# Patient Record
Sex: Male | Born: 1985 | Race: White | Hispanic: No | Marital: Single | Smoking: Current every day smoker
Health system: Southern US, Community
[De-identification: ages and names within clinical notes are randomized; demographics above are authoritative.]

## PROBLEM LIST (undated history)

## (undated) DIAGNOSIS — G43909 Migraine, unspecified, not intractable, without status migrainosus: Secondary | ICD-10-CM

## (undated) HISTORY — PX: HYPOSPADIAS CORRECTION: SHX483

---
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
## 2008-01-21 ENCOUNTER — Emergency Department: Payer: Self-pay | Admitting: Emergency Medicine

## 2008-05-07 ENCOUNTER — Emergency Department: Payer: Self-pay | Admitting: Internal Medicine

## 2008-05-07 ENCOUNTER — Other Ambulatory Visit: Payer: Self-pay

## 2008-05-10 ENCOUNTER — Emergency Department: Payer: Self-pay | Admitting: Emergency Medicine

## 2008-05-23 ENCOUNTER — Emergency Department: Payer: Self-pay | Admitting: Emergency Medicine

## 2008-05-31 ENCOUNTER — Emergency Department: Payer: Self-pay | Admitting: Emergency Medicine

## 2008-06-11 ENCOUNTER — Emergency Department: Payer: Self-pay | Admitting: Emergency Medicine

## 2008-06-25 ENCOUNTER — Emergency Department (HOSPITAL_COMMUNITY): Admission: EM | Admit: 2008-06-25 | Discharge: 2008-06-25 | Payer: Self-pay | Admitting: *Deleted

## 2013-03-22 ENCOUNTER — Encounter (HOSPITAL_COMMUNITY): Payer: Self-pay

## 2013-03-22 ENCOUNTER — Emergency Department (HOSPITAL_COMMUNITY)
Admission: EM | Admit: 2013-03-22 | Discharge: 2013-03-23 | Disposition: A | Discharge status: Home / Self Care | Payer: Self-pay | Attending: Emergency Medicine | Admitting: Emergency Medicine

## 2013-03-22 DIAGNOSIS — F17200 Nicotine dependence, unspecified, uncomplicated: Secondary | ICD-10-CM | POA: Insufficient documentation

## 2013-03-22 DIAGNOSIS — R05 Cough: Secondary | ICD-10-CM | POA: Insufficient documentation

## 2013-03-22 DIAGNOSIS — R1110 Vomiting, unspecified: Secondary | ICD-10-CM | POA: Insufficient documentation

## 2013-03-22 DIAGNOSIS — R059 Cough, unspecified: Secondary | ICD-10-CM | POA: Insufficient documentation

## 2013-03-22 DIAGNOSIS — G43909 Migraine, unspecified, not intractable, without status migrainosus: Secondary | ICD-10-CM | POA: Insufficient documentation

## 2013-03-22 HISTORY — DX: Migraine, unspecified, not intractable, without status migrainosus: G43.909

## 2013-03-22 MED ORDER — ONDANSETRON HCL 4 MG/2ML IJ SOLN
4.0000 mg | Freq: Once | INTRAMUSCULAR | Status: AC
  Administered 2013-03-23: 4 mg via INTRAVENOUS
  Filled 2013-03-22: qty 2

## 2013-03-22 MED ORDER — SODIUM CHLORIDE 0.9 % IV BOLUS (SEPSIS)
2000.0000 mL | Freq: Once | INTRAVENOUS | Status: AC
  Administered 2013-03-23: 2000 mL via INTRAVENOUS

## 2013-03-22 NOTE — ED Notes (Signed)
[id number]<HY> Expected date:<BR> Expected time:<BR> Means of arrival:<BR> Comments:<BR> EMS/26 yo with migraine/vomiting

## 2013-03-22 NOTE — ED Notes (Signed)
Per EMS: Pt c/o of migraine and vomiting. HA started 30 minutes before call. Pt also reports having flu like symptoms and cough. No allergies. Hx of migraines.

## 2013-03-23 ENCOUNTER — Encounter (HOSPITAL_COMMUNITY): Payer: Self-pay

## 2013-03-23 LAB — URINALYSIS, ROUTINE W REFLEX MICROSCOPIC
Bilirubin Urine: NEGATIVE
Glucose, UA: NEGATIVE mg/dL
Hgb urine dipstick: NEGATIVE
Ketones, ur: NEGATIVE mg/dL
Leukocytes, UA: NEGATIVE
Nitrite: NEGATIVE
Protein, ur: NEGATIVE mg/dL
Specific Gravity, Urine: 1.014 (ref 1.005–1.030)
Urobilinogen, UA: 0.2 mg/dL (ref 0.0–1.0)
pH: 6 (ref 5.0–8.0)

## 2013-03-23 MED ORDER — PROMETHAZINE-DM 6.25-15 MG/5ML PO SYRP: 5.0000 mL | ORAL_SOLUTION | Freq: Four times a day (QID) | ORAL | Status: DC | PRN

## 2013-03-23 MED ORDER — METOCLOPRAMIDE HCL 5 MG/ML IJ SOLN
10.0000 mg | Freq: Once | INTRAMUSCULAR | Status: AC
  Administered 2013-03-23: 10 mg via INTRAVENOUS
  Filled 2013-03-23: qty 2

## 2013-03-23 MED ORDER — DIPHENHYDRAMINE HCL 50 MG/ML IJ SOLN
25.0000 mg | Freq: Once | INTRAMUSCULAR | Status: AC
  Administered 2013-03-23: 25 mg via INTRAVENOUS
  Filled 2013-03-23: qty 1

## 2013-03-23 MED ORDER — KETOROLAC TROMETHAMINE 30 MG/ML IJ SOLN
30.0000 mg | Freq: Once | INTRAMUSCULAR | Status: AC
  Administered 2013-03-23: 30 mg via INTRAVENOUS
  Filled 2013-03-23: qty 1

## 2013-03-23 MED ORDER — GUAIFENESIN ER 1200 MG PO TB12: 1.0000 | ORAL_TABLET | Freq: Two times a day (BID) | ORAL | Status: DC

## 2013-03-23 NOTE — ED Provider Notes (Signed)
History     CSN: [id number]  Arrival date & time 03/22/13  2335   First MD Initiated Contact with Patient 03/22/13 2349      Chief Complaint  Patient presents with  . Migraine  . Emesis    (Consider location/radiation/quality/duration/timing/severity/associated sxs/prior treatment) HPI History presents emergency department with migraine headache.  Patient, states, that he's been coughing and he had a coughing fit, which set off a migraine headache.  Patient denies chest pain, shortness of breath, visual changes, vomiting, diarrhea, abdominal pain, weakness, fever, or rash.  He should states he did not take any medications prior to arrival for his symptoms.  Light and sounds seem to make his headache, worse Past Medical History  Diagnosis Date  . Migraine     History reviewed. No pertinent past surgical history.  History reviewed. No pertinent family history.  History  Substance Use Topics  . Smoking status: Current Every Day Smoker    Types: Cigarettes  . Smokeless tobacco: Not on file  . Alcohol Use: Yes      Review of Systems All other systems negative except as documented in the HPI. All pertinent positives and negatives as reviewed in the HPI. Allergies  Review of patient's allergies indicates no known allergies.  Home Medications  No current outpatient prescriptions on file.  BP 125/77  Pulse 96  Temp(Src) 98.9 F (37.2 C) (Oral)  Resp 26  Wt 180 lb (81.647 kg)  SpO2 93%  Physical Exam  Nursing note and vitals reviewed. Constitutional: He is oriented to person, place, and time. He appears well-developed and well-nourished. No distress.  HENT:  Head: Normocephalic and atraumatic.  Mouth/Throat: Oropharynx is clear and moist.  Eyes: EOM are normal. Pupils are equal, round, and reactive to light.  Neck: Normal range of motion. Neck supple.  Cardiovascular: Normal rate and regular rhythm.  Exam reveals no gallop and no friction rub.   No murmur  heard. Pulmonary/Chest: Effort normal and breath sounds normal. No respiratory distress.  Neurological: He is alert and oriented to person, place, and time. He exhibits normal muscle tone. Coordination normal.  Skin: Skin is warm and dry.    ED Course  Procedures (including critical care time)  Labs Reviewed  URINALYSIS, ROUTINE W REFLEX MICROSCOPIC   Aisha is given migraine cocktail and Toradol, Benadryl and Reglan and has complete resolution of his migraine headache.  Patient is advised to return here as needed.  Told to increase his fluid intake.  Patient does not have any neurological deficits noted on exam.  MDM          Carlyle Dolly, PA-C 03/27/13 0119

## 2013-03-27 NOTE — ED Provider Notes (Signed)
Medical screening examination/treatment/procedure(s) were performed by non-physician practitioner and as supervising physician I was immediately available for consultation/collaboration.  John-Adam Graylen [place], M.D.   John-Adam Ludie Pavlik, MD 03/27/13 0447 

## 2013-04-05 ENCOUNTER — Emergency Department (HOSPITAL_COMMUNITY)
Admission: EM | Admit: 2013-04-05 | Discharge: 2013-04-06 | Disposition: A | Discharge status: Home / Self Care | Payer: Self-pay | Attending: Emergency Medicine | Admitting: Emergency Medicine

## 2013-04-05 ENCOUNTER — Encounter (HOSPITAL_COMMUNITY): Payer: Self-pay | Admitting: *Deleted

## 2013-04-05 DIAGNOSIS — F17200 Nicotine dependence, unspecified, uncomplicated: Secondary | ICD-10-CM | POA: Insufficient documentation

## 2013-04-05 DIAGNOSIS — H53149 Visual discomfort, unspecified: Secondary | ICD-10-CM | POA: Insufficient documentation

## 2013-04-05 DIAGNOSIS — M545 Low back pain, unspecified: Secondary | ICD-10-CM | POA: Insufficient documentation

## 2013-04-05 DIAGNOSIS — G43909 Migraine, unspecified, not intractable, without status migrainosus: Secondary | ICD-10-CM | POA: Insufficient documentation

## 2013-04-05 DIAGNOSIS — M549 Dorsalgia, unspecified: Secondary | ICD-10-CM

## 2013-04-05 NOTE — ED Notes (Signed)
Severe migraine since ~ 1900.  Did have some emesis, mild blurred vision. X 2 days jumped 10 feet, landed on feet and having shooting low and upper back pain. Duration 10-15 minutes. [facility]

## 2013-04-06 MED ORDER — METOCLOPRAMIDE HCL 5 MG/ML IJ SOLN
10.0000 mg | Freq: Once | INTRAMUSCULAR | Status: AC
  Administered 2013-04-06: 10 mg via INTRAVENOUS
  Filled 2013-04-06: qty 2

## 2013-04-06 MED ORDER — KETOROLAC TROMETHAMINE 30 MG/ML IJ SOLN
30.0000 mg | Freq: Once | INTRAMUSCULAR | Status: AC
  Administered 2013-04-06: 30 mg via INTRAVENOUS
  Filled 2013-04-06: qty 1

## 2013-04-06 MED ORDER — DIPHENHYDRAMINE HCL 50 MG/ML IJ SOLN
12.5000 mg | Freq: Once | INTRAMUSCULAR | Status: AC
  Administered 2013-04-06: 12.5 mg via INTRAVENOUS
  Filled 2013-04-06: qty 1

## 2013-04-06 MED ORDER — IBUPROFEN 600 MG PO TABS: 600.0000 mg | ORAL_TABLET | Freq: Four times a day (QID) | ORAL | Status: DC | PRN

## 2013-04-06 MED ORDER — SODIUM CHLORIDE 0.9 % IV BOLUS (SEPSIS)
1000.0000 mL | Freq: Once | INTRAVENOUS | Status: AC
  Administered 2013-04-06: 1000 mL via INTRAVENOUS

## 2013-04-06 NOTE — ED Provider Notes (Signed)
Medical screening examination/treatment/procedure(s) were performed by non-physician practitioner and as supervising physician I was immediately available for consultation/collaboration.  Teairra Millar K Sheera Illingworth-Rasch, MD 04/06/13 0344 

## 2013-04-06 NOTE — ED Provider Notes (Signed)
History     CSN: [id number]  Arrival date & time 04/05/13  2104   First MD Initiated Contact with Patient 04/06/13 0004      Chief Complaint  Patient presents with  . Migraine  . Back Pain    (Consider location/radiation/quality/duration/timing/severity/associated sxs/prior treatment) HPI Comments: Should the history of migraine headaches, states she's had a headache since about 7:00 tonight.  He also states, that for the last 2, days.  He's had low back pain.  He has chronic back pain, but this was exacerbated by jumping off a building 2, days, ago.  He has not taken any medication."  He has no money."  Patient is a 27 y.o. male presenting with migraines and back pain. The history is provided by the patient.  Migraine This is a recurrent problem. The current episode started today. The problem occurs intermittently. The problem has been unchanged. Associated symptoms include headaches. Pertinent negatives include no chills, fever or rash. Nothing aggravates the symptoms. He has tried nothing for the symptoms.  Back Pain Associated symptoms: headaches   Associated symptoms: no fever     Past Medical History  Diagnosis Date  . Migraine     History reviewed. No pertinent past surgical history.  No family history on file.  History  Substance Use Topics  . Smoking status: Current Every Day Smoker    Types: Cigarettes  . Smokeless tobacco: Not on file  . Alcohol Use: Yes      Review of Systems  Constitutional: Negative for fever and chills.  Eyes: Positive for photophobia. Negative for visual disturbance.  Musculoskeletal: Positive for back pain.  Skin: Negative for rash.  Neurological: Positive for headaches. Negative for dizziness.  All other systems reviewed and are negative.    Allergies  Review of patient's allergies indicates no known allergies.  Home Medications   Current Outpatient Rx  Name  Route  Sig  Dispense  Refill  . GUAIFENESIN PO   Oral   Take  1 tablet by mouth 2 (two) times daily as needed (for cough and congestion).         Marland Kitchen ibuprofen (ADVIL,MOTRIN) 600 MG tablet   Oral   Take 1 tablet (600 mg total) by mouth every 6 (six) hours as needed for pain.   30 tablet   0     BP 129/79  Pulse 87  Temp(Src) 98.4 F (36.9 C) (Oral)  Resp 16  SpO2 97%  Physical Exam  Nursing note and vitals reviewed. Constitutional: He appears well-developed and well-nourished.  HENT:  Head: Normocephalic.  Eyes: Pupils are equal, round, and reactive to light.  Neck: Normal range of motion.  Cardiovascular: Normal rate.   Abdominal: Soft.  Musculoskeletal:       Back:  Neurological: He is alert.  Skin: Skin is warm and dry.    ED Course  Procedures (including critical care time)  Labs Reviewed - No data to display No results found.   1. Migraine headache   2. Back pain       MDM  Vision, sleeping after being, medicated we'll discharge home with a prescription for ibuprofen, and our resource list         Arman Filter, NP 04/06/13 0206

## 2013-04-21 ENCOUNTER — Emergency Department (HOSPITAL_COMMUNITY): Payer: Self-pay

## 2013-04-21 ENCOUNTER — Encounter (HOSPITAL_COMMUNITY): Payer: Self-pay | Admitting: Emergency Medicine

## 2013-04-21 ENCOUNTER — Emergency Department (HOSPITAL_COMMUNITY)
Admission: EM | Admit: 2013-04-21 | Discharge: 2013-04-22 | Disposition: A | Discharge status: Home / Self Care | Payer: Self-pay | Attending: Emergency Medicine | Admitting: Emergency Medicine

## 2013-04-21 DIAGNOSIS — J3489 Other specified disorders of nose and nasal sinuses: Secondary | ICD-10-CM | POA: Insufficient documentation

## 2013-04-21 DIAGNOSIS — R05 Cough: Secondary | ICD-10-CM | POA: Insufficient documentation

## 2013-04-21 DIAGNOSIS — Z8679 Personal history of other diseases of the circulatory system: Secondary | ICD-10-CM | POA: Insufficient documentation

## 2013-04-21 DIAGNOSIS — R51 Headache: Secondary | ICD-10-CM | POA: Insufficient documentation

## 2013-04-21 DIAGNOSIS — J40 Bronchitis, not specified as acute or chronic: Secondary | ICD-10-CM

## 2013-04-21 DIAGNOSIS — R062 Wheezing: Secondary | ICD-10-CM | POA: Insufficient documentation

## 2013-04-21 DIAGNOSIS — H6690 Otitis media, unspecified, unspecified ear: Secondary | ICD-10-CM | POA: Insufficient documentation

## 2013-04-21 DIAGNOSIS — F17200 Nicotine dependence, unspecified, uncomplicated: Secondary | ICD-10-CM | POA: Insufficient documentation

## 2013-04-21 DIAGNOSIS — H6692 Otitis media, unspecified, left ear: Secondary | ICD-10-CM

## 2013-04-21 DIAGNOSIS — R059 Cough, unspecified: Secondary | ICD-10-CM | POA: Insufficient documentation

## 2013-04-21 DIAGNOSIS — J209 Acute bronchitis, unspecified: Secondary | ICD-10-CM | POA: Insufficient documentation

## 2013-04-21 NOTE — ED Notes (Signed)
Pt c/o cough, SHOB, "sore ears" x 3 weeks, pt also c/o sinus pain/pressure

## 2013-04-21 NOTE — ED Provider Notes (Signed)
History    This chart was scribed for Jonathon Clay, a non-physician practitioner working with Gavin Pound. Oletta Lamas, MD by Frederik Pear, ED Scribe. This patient was seen in room WTR7/WTR7 and the patient's care was started at 2353.   CSN: [id number]  Arrival date & time 04/21/13  2208   First MD Initiated Contact with Patient 04/21/13 2353      Chief Complaint  Patient presents with  . URI    (Consider location/radiation/quality/duration/timing/severity/associated sxs/prior treatment) The history is provided by the patient and medical records. No language interpreter was used.   HPI Comments: Jonathon Clay is a 27 y.o. male who is currently homeless and presents to the Emergency Department complaining of constant, worsening, moderate wheezing, rhinorrhea, and cough that began 3 weeks ago. He also complains of left otalgia that radiates down his left jaw that began today. In the ED, he complains of a headache that began after arrival. Headache is similar to previous headaches. He is afebrile. He states that he has a h/o of headaches and found relief previously from Excedrin.  Past Medical History  Diagnosis Date  . Migraine     Past Surgical History  Procedure Laterality Date  . Hypospadias correction      No family history on file.  History  Substance Use Topics  . Smoking status: Current Every Day Smoker    Types: Cigarettes  . Smokeless tobacco: Not on file  . Alcohol Use: Yes     Comment: occ      Review of Systems  Constitutional: Negative for fever, diaphoresis, appetite change and unexpected weight change.  HENT: Positive for ear pain and rhinorrhea. Negative for mouth sores and neck stiffness.   Eyes: Negative for visual disturbance.  Respiratory: Positive for cough and wheezing. Negative for chest tightness and shortness of breath.   Cardiovascular: Negative for chest pain.  Gastrointestinal: Negative for nausea, vomiting, abdominal pain, diarrhea and  constipation.  Endocrine: Negative for polydipsia, polyphagia and polyuria.  Genitourinary: Negative for dysuria, urgency, frequency and hematuria.  Musculoskeletal: Negative for back pain.  Skin: Negative for rash.  Allergic/Immunologic: Negative for immunocompromised state.  Neurological: Positive for headaches. Negative for syncope and light-headedness.  Hematological: Does not bruise/bleed easily.  Psychiatric/Behavioral: Negative for sleep disturbance. The patient is not nervous/anxious.     Allergies  Review of patient's allergies indicates no known allergies.  Home Medications   Current Outpatient Rx  Name  Route  Sig  Dispense  Refill  . GUAIFENESIN PO   Oral   Take 1 tablet by mouth 2 (two) times daily as needed (for cough and congestion).         Marland Kitchen ibuprofen (ADVIL,MOTRIN) 600 MG tablet   Oral   Take 1 tablet (600 mg total) by mouth every 6 (six) hours as needed for pain.   30 tablet   0     BP 125/71  Pulse 73  Temp(Src) 98.7 F (37.1 C) (Oral)  Resp 20  Ht 5\' 11"  (1.803 m)  Wt 185 lb (83.915 kg)  BMI 25.81 kg/m2  SpO2 98%  Physical Exam  Nursing note and vitals reviewed. Constitutional: He appears well-developed and well-nourished. No distress.  HENT:  Head: Normocephalic and atraumatic.  Right Ear: Tympanic membrane normal.  Left Ear: Tympanic membrane is erythematous and bulging.  Nose: Rhinorrhea present.  Mouth/Throat: Uvula is midline, oropharynx is clear and moist and mucous membranes are normal.  Left TM is erythematous and bulging.   Eyes: EOM are normal.  Pupils are equal, round, and reactive to light.  Neck: Normal range of motion. Neck supple. No tracheal deviation present.  Cardiovascular: Normal rate.   Pulmonary/Chest: Effort normal. No respiratory distress.  Mild bilateral expiratory wheezes in the bases of the lungs.  Abdominal: Soft. He exhibits no distension.  Musculoskeletal: Normal range of motion. He exhibits no edema.   Neurological: He is alert.  Skin: Skin is warm and dry.  Psychiatric: He has a normal mood and affect. His behavior is normal.    ED Course  Procedures (including critical care time)  DIAGNOSTIC STUDIES: Oxygen Saturation is 98% on room air, normal by my interpretation.    COORDINATION OF CARE:  23:59- Discussed planned course of treatment with the patient, including Toradol, Amoxicillin, and Albuterol, who is agreeable at this time.  00:30- Medication Orders- ketorolac (toradol) injection 60 mg- once, amoxicillin (amoxil) capsule 1,000 mg- once, albuterol (proventil hfa; ventolin hfa) 108 (90 base) mcg/act inhaler 2 puff- every 4 hours.   Labs Reviewed - No data to display Dg Chest 2 View  04/21/2013   *RADIOLOGY REPORT*  Clinical Data: Cough, congestion, chest pain.  CHEST - 2 VIEW  Comparison: None.  Findings: Lungs clear.  Heart size and pulmonary vascularity normal.  No effusion.  Visualized bones unremarkable.  IMPRESSION: No acute disease   Original Report Authenticated By: D. Andria Rhein, MD     1. Otitis media, left   2. Bronchitis    Patient seen and examined.  Medications ordered.   Vital signs reviewed and are as follows: Filed Vitals:   04/21/13 2250  BP: 125/71  Pulse: 73  Temp: 98.7 F (37.1 C)  Resp: 20   Patient urged to return with worsening symptoms or other concerns. Patient verbalized understanding and agrees with plan.      MDM  Patient with otitis media and bronchitis. Will treat symptomatically and with course of amoxicillin. Vital signs are stable. Patient appears well, nontoxic. Headache is similar to previous and will be treated with Toradol. Patient is grossly neurologically intact. No head injury. Do not suspect serious intracranial etiology.  I personally performed the services described in this documentation, which was scribed in my presence. The recorded information has been reviewed and is accurate.         Jonathon Crigler,  PA-C 04/22/13 [phone]

## 2013-04-21 NOTE — ED Notes (Signed)
Per EMS, patient has had shortness of breath off and on for the past 3 weeks. Reports productive cough with "cream colored" sputum. States shortness of breath is worse at night.

## 2013-04-22 MED ORDER — AMOXICILLIN 500 MG PO CAPS: 1000.0000 mg | ORAL_CAPSULE | Freq: Three times a day (TID) | ORAL | Status: DC

## 2013-04-22 MED ORDER — KETOROLAC TROMETHAMINE 60 MG/2ML IM SOLN
60.0000 mg | Freq: Once | INTRAMUSCULAR | Status: AC
  Administered 2013-04-22: 60 mg via INTRAMUSCULAR
  Filled 2013-04-22: qty 2

## 2013-04-22 MED ORDER — ALBUTEROL SULFATE HFA 108 (90 BASE) MCG/ACT IN AERS
2.0000 | INHALATION_SPRAY | RESPIRATORY_TRACT | Status: DC | PRN
  Administered 2013-04-22: 2 via RESPIRATORY_TRACT
  Filled 2013-04-22: qty 6.7

## 2013-04-22 MED ORDER — AMOXICILLIN 500 MG PO CAPS
1000.0000 mg | ORAL_CAPSULE | Freq: Once | ORAL | Status: AC
  Administered 2013-04-22: 1000 mg via ORAL
  Filled 2013-04-22: qty 2

## 2013-04-23 NOTE — ED Provider Notes (Signed)
Medical screening examination/treatment/procedure(s) were performed by non-physician practitioner and as supervising physician I was immediately available for consultation/collaboration.   Danney Y. Shaily Librizzi, MD 04/23/13 1530 

## 2013-06-29 ENCOUNTER — Emergency Department (HOSPITAL_COMMUNITY): Payer: Self-pay

## 2013-06-29 ENCOUNTER — Emergency Department (HOSPITAL_COMMUNITY)
Admission: EM | Admit: 2013-06-29 | Discharge: 2013-06-30 | Disposition: A | Discharge status: Home / Self Care | Payer: Self-pay | Attending: Emergency Medicine | Admitting: Emergency Medicine

## 2013-06-29 DIAGNOSIS — J40 Bronchitis, not specified as acute or chronic: Secondary | ICD-10-CM | POA: Insufficient documentation

## 2013-06-29 DIAGNOSIS — J029 Acute pharyngitis, unspecified: Secondary | ICD-10-CM | POA: Insufficient documentation

## 2013-06-29 DIAGNOSIS — F17200 Nicotine dependence, unspecified, uncomplicated: Secondary | ICD-10-CM | POA: Insufficient documentation

## 2013-06-29 DIAGNOSIS — R112 Nausea with vomiting, unspecified: Secondary | ICD-10-CM | POA: Insufficient documentation

## 2013-06-29 DIAGNOSIS — J3489 Other specified disorders of nose and nasal sinuses: Secondary | ICD-10-CM | POA: Insufficient documentation

## 2013-06-29 DIAGNOSIS — R42 Dizziness and giddiness: Secondary | ICD-10-CM | POA: Insufficient documentation

## 2013-06-29 DIAGNOSIS — R51 Headache: Secondary | ICD-10-CM | POA: Insufficient documentation

## 2013-06-29 DIAGNOSIS — Z8679 Personal history of other diseases of the circulatory system: Secondary | ICD-10-CM | POA: Insufficient documentation

## 2013-06-29 MED ORDER — ONDANSETRON 4 MG PO TBDP
4.0000 mg | ORAL_TABLET | Freq: Once | ORAL | Status: AC
  Administered 2013-06-29: 4 mg via ORAL
  Filled 2013-06-29: qty 1

## 2013-06-29 MED ORDER — GUAIFENESIN-CODEINE 100-10 MG/5ML PO SOLN
10.0000 mL | Freq: Once | ORAL | Status: AC
  Administered 2013-06-29: 10 mL via ORAL
  Filled 2013-06-29: qty 10

## 2013-06-29 NOTE — ED Notes (Signed)
Pt states "my bronchitis is acting up". Pt c/o headache, dizziness, vomiting. Pt states he has a productive cough with chest pain. Symptoms for 3 days. Pt states now he has dry heaves. Pt has been taking Dayquil and Nyquil with no relief. Pt with no acute distress. Ambulatory to exam room with steady gait.

## 2013-06-30 MED ORDER — AZITHROMYCIN 250 MG PO TABS: 250.0000 mg | ORAL_TABLET | Freq: Every day | ORAL | Status: AC

## 2013-06-30 MED ORDER — GUAIFENESIN 100 MG/5ML PO LIQD: 100.0000 mg | ORAL | Status: AC | PRN

## 2013-06-30 MED ORDER — AZITHROMYCIN 250 MG PO TABS
500.0000 mg | ORAL_TABLET | Freq: Once | ORAL | Status: AC
  Administered 2013-06-30: 500 mg via ORAL
  Filled 2013-06-30: qty 2
  Filled 2013-06-30: qty 1

## 2013-06-30 MED ORDER — ALBUTEROL SULFATE HFA 108 (90 BASE) MCG/ACT IN AERS
2.0000 | INHALATION_SPRAY | RESPIRATORY_TRACT | Status: DC | PRN
  Administered 2013-06-30: 2 via RESPIRATORY_TRACT
  Filled 2013-06-30: qty 6.7

## 2013-06-30 NOTE — ED Provider Notes (Signed)
Medical screening examination/treatment/procedure(s) were performed by non-physician practitioner and as supervising physician I was immediately available for consultation/collaboration.   Enid Skeens, MD 06/30/13 [phone]

## 2013-06-30 NOTE — ED Provider Notes (Signed)
CSN: [id number]     Arrival date & time 06/29/13  2253 History     First MD Initiated Contact with Patient 06/29/13 2312     Chief Complaint  Patient presents with  . Cough  . Dizziness  . Nausea   (Consider location/radiation/quality/duration/timing/severity/associated sxs/prior Treatment) HPI  Jonathon Clay is a 27 y.o.male without any significant PMH presents to the ER with complaints of coughing, sneezing, sore throat, post tussive vomiting, headache for the past 3 days. He says he has a history of bronchitis. He denies sore throat, ear pain, dysuria, wheezing, fevers, nausea, vomiting or diarrhea. Pt otherwise is healthy   Past Medical History  Diagnosis Date  . Migraine    Past Surgical History  Procedure Laterality Date  . Hypospadias correction     No family history on file. History  Substance Use Topics  . Smoking status: Current Every Day Smoker    Types: Cigarettes  . Smokeless tobacco: Not on file  . Alcohol Use: Yes     Comment: occ    Review of Systems ROS is negative unless otherwise stated in the HPI  Allergies  Review of patient's allergies indicates no known allergies.  Home Medications   Current Outpatient Rx  Name  Route  Sig  Dispense  Refill  . acetaminophen (TYLENOL) 500 MG tablet   Oral   Take 500 mg by mouth every 6 (six) hours as needed for pain.         Marland Kitchen guaiFENesin (ROBITUSSIN) 100 MG/5ML SOLN   Oral   Take 10 mLs by mouth every 4 (four) hours as needed (cough).         . Pseudoephedrine-APAP-DM (DAYQUIL PO)   Oral   Take 2 capsules by mouth 2 (two) times daily as needed (cold symptoms).         Marland Kitchen azithromycin (ZITHROMAX) 250 MG tablet   Oral   Take 1 tablet (250 mg total) by mouth daily. Take first 2 tablets together, then 1 every day until finished.   6 tablet   0   . guaiFENesin (ROBITUSSIN) 100 MG/5ML liquid   Oral   Take 5-10 mLs (100-200 mg total) by mouth every 4 (four) hours as needed for cough.   60 mL    0    BP 122/72  Pulse 113  Temp(Src) 99.3 F (37.4 C) (Oral)  Resp 18  SpO2 98% Physical Exam  Nursing note and vitals reviewed. Constitutional: He appears well-developed and well-nourished. No distress. He is not intubated.  HENT:  Head: Normocephalic and atraumatic.   HEENT: Anicteric.  No pallor.  No discharge from ears, eyes, nose, or mouth.   Eyes: Pupils are equal, round, and reactive to light.  Neck: Normal range of motion. Neck supple.  Cardiovascular: Normal rate and regular rhythm.   Pulmonary/Chest: Effort normal. No accessory muscle usage. He is not intubated. No respiratory distress. He has no wheezes. He has rhonchi.  Abdominal: Soft.  Neurological: He is alert.  Skin: Skin is warm and dry.    ED Course   Procedures (including critical care time)  Labs Reviewed - No data to display Dg Chest 2 View  06/30/2013   *RADIOLOGY REPORT*  Clinical Data: Cough, congestion, shortness of breath, chest pain. Smoker.  CHEST - 2 VIEW  Comparison: 04/21/2013  Findings: The heart size and pulmonary vascularity are normal. The lungs appear clear and expanded without focal air space disease or consolidation. No blunting of the costophrenic angles.  No pneumothorax.  Mediastinal contours appear intact.  No significant changes since previous study.  IMPRESSION: No evidence of active pulmonary disease.   Original Report Authenticated By: Burman Nieves, M.D.   1. Bronchitis     MDM  Pt clinically with bronchitis. Will give albuterol HFA and Azithromycin.  Rx for guaifenesin and azithromycin. Supportive treatment. 27 y.o.Jonathon Clay's evaluation in the Emergency Department is complete. It has been determined that no acute conditions requiring further emergency intervention are present at this time. The patient/guardian have been advised of the diagnosis and plan. We have discussed signs and symptoms that warrant return to the ED, such as changes or worsening in symptoms.  Vital  signs are stable at discharge. Filed Vitals:   06/29/13 2308  BP: 122/72  Pulse: 113  Temp: 99.3 F (37.4 C)  Resp: 18    Patient/guardian has voiced understanding and agreed to follow-up with the PCP or specialist.    Dorthula Matas, PA-C 06/30/13 0023

## 2013-11-05 IMAGING — CR DG CHEST 2V
2 series · 2 of 2 positions shown · non-contrast
Comparison: None.

CLINICAL DATA: Cough, congestion, chest pain.

CHEST - 2 VIEW

[w chest pa]
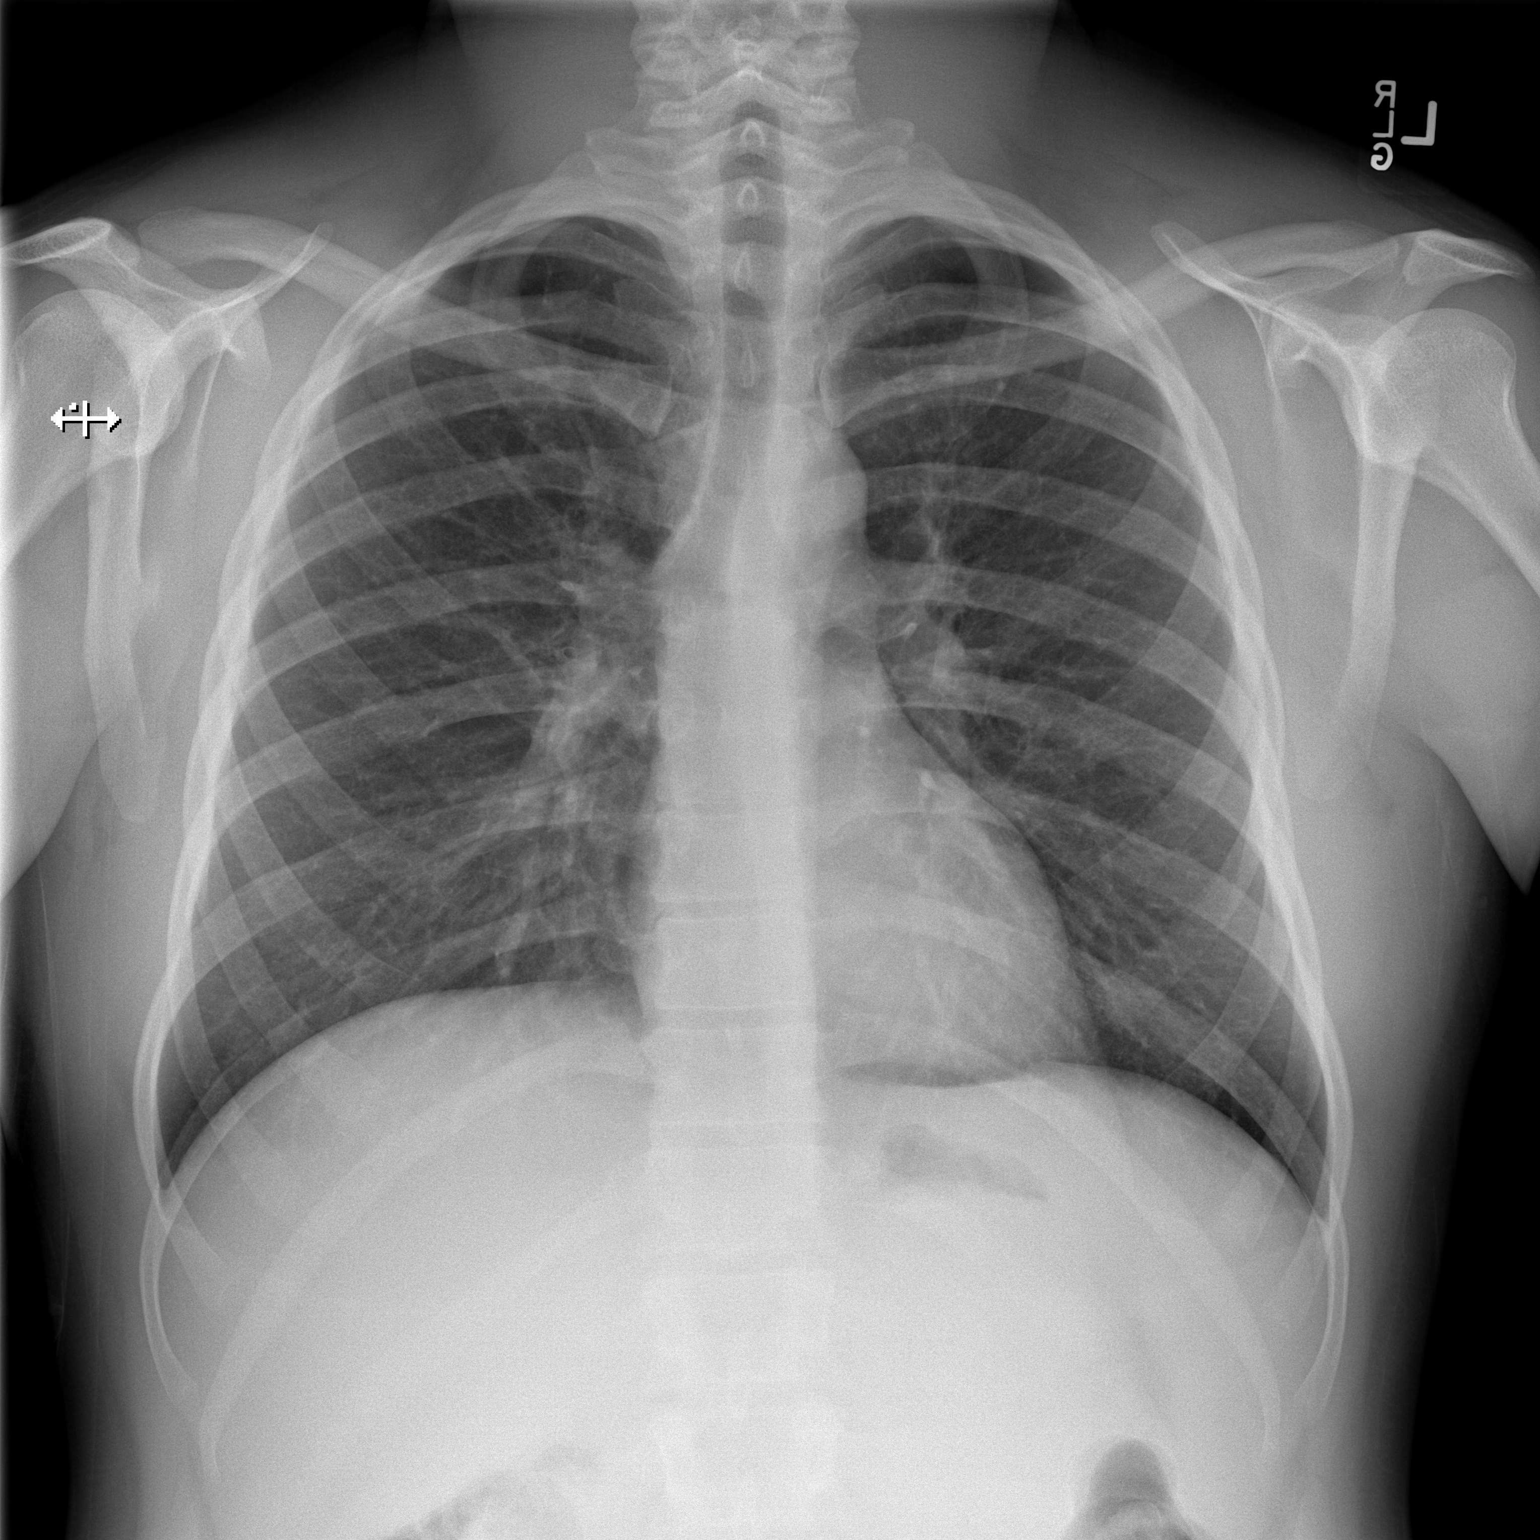

[w chest lat]
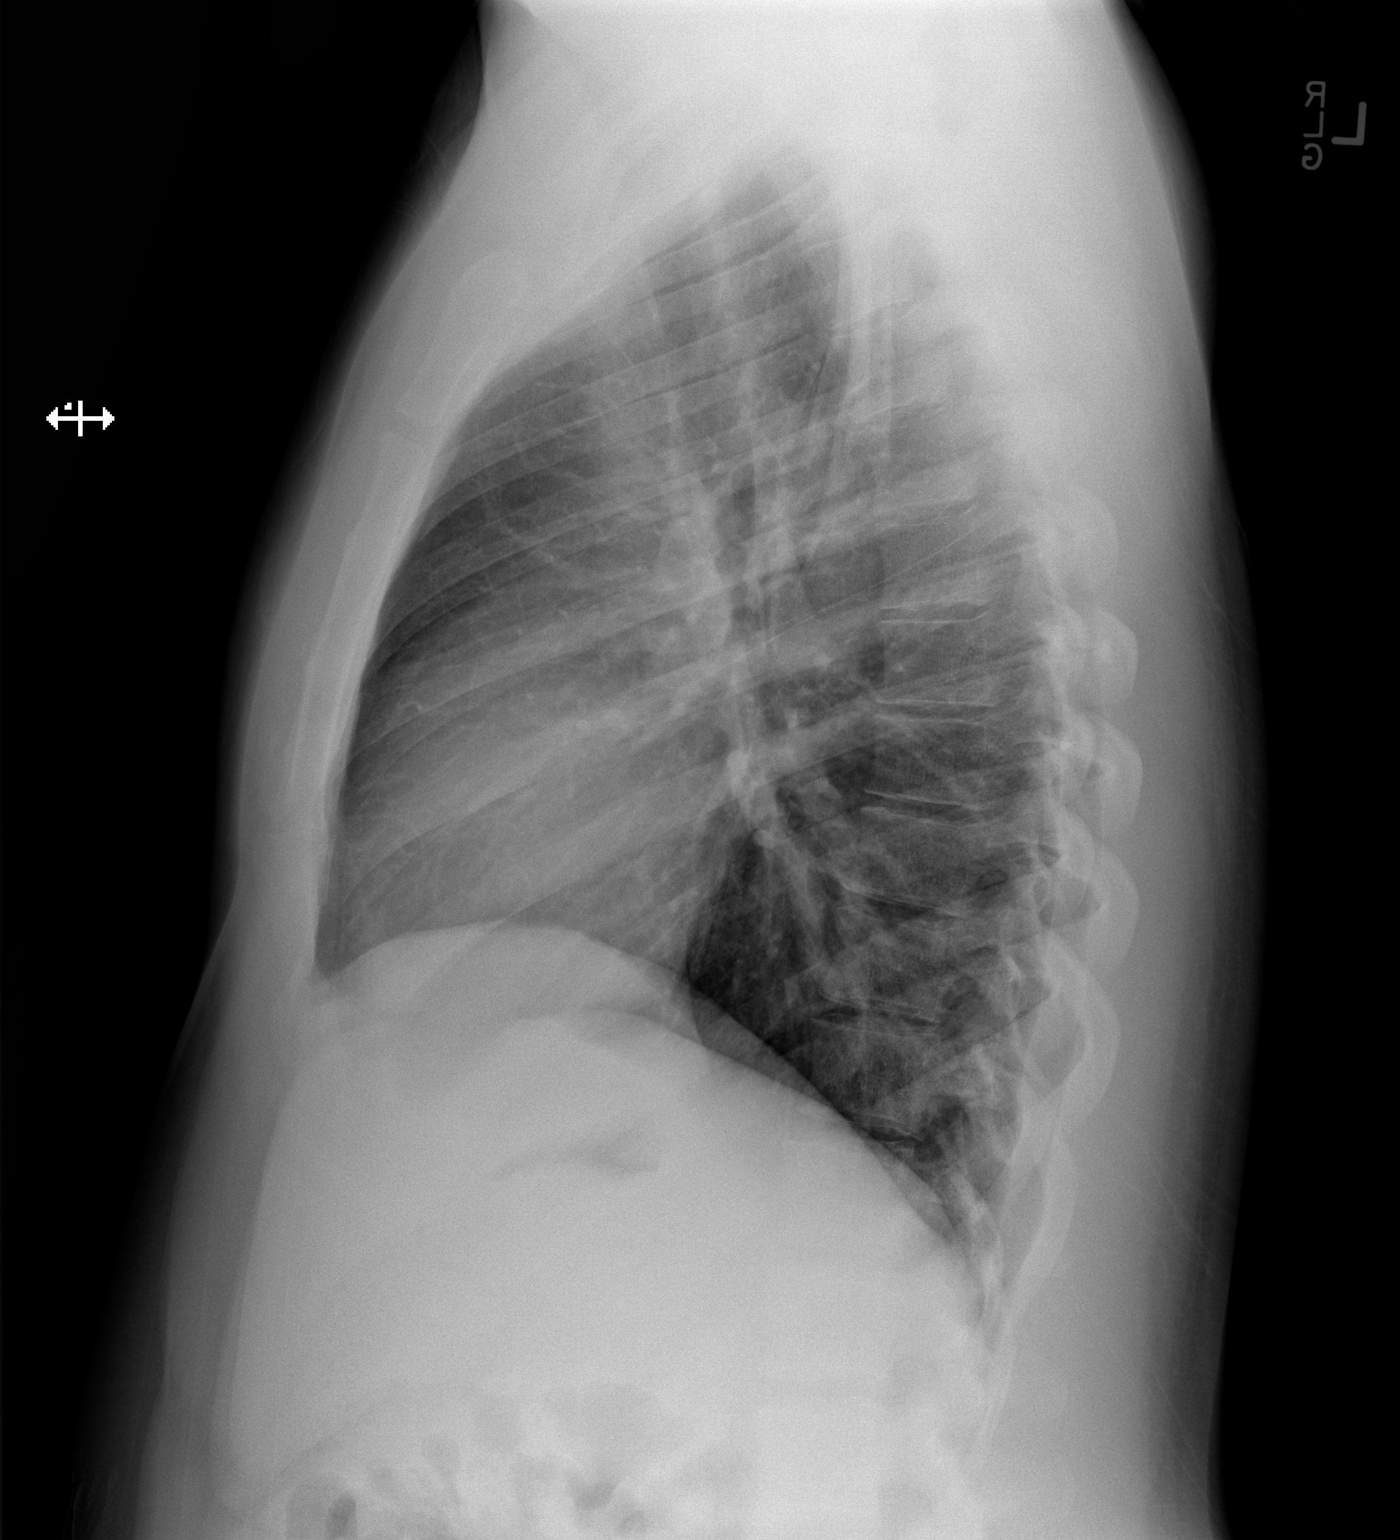

[2 of 2 positions shown; findings below may reference images not displayed]

FINDINGS: Lungs clear.  Heart size and pulmonary vascularity
normal.  No effusion.  Visualized bones unremarkable.
IMPRESSION: No acute disease

## 2014-01-13 IMAGING — CR DG CHEST 2V
2 series · 2 of 2 positions shown · non-contrast
Comparison: 04/21/2013

CLINICAL DATA: Cough, congestion, shortness of breath, chest pain.
Smoker.

CHEST - 2 VIEW

[w chest pa]
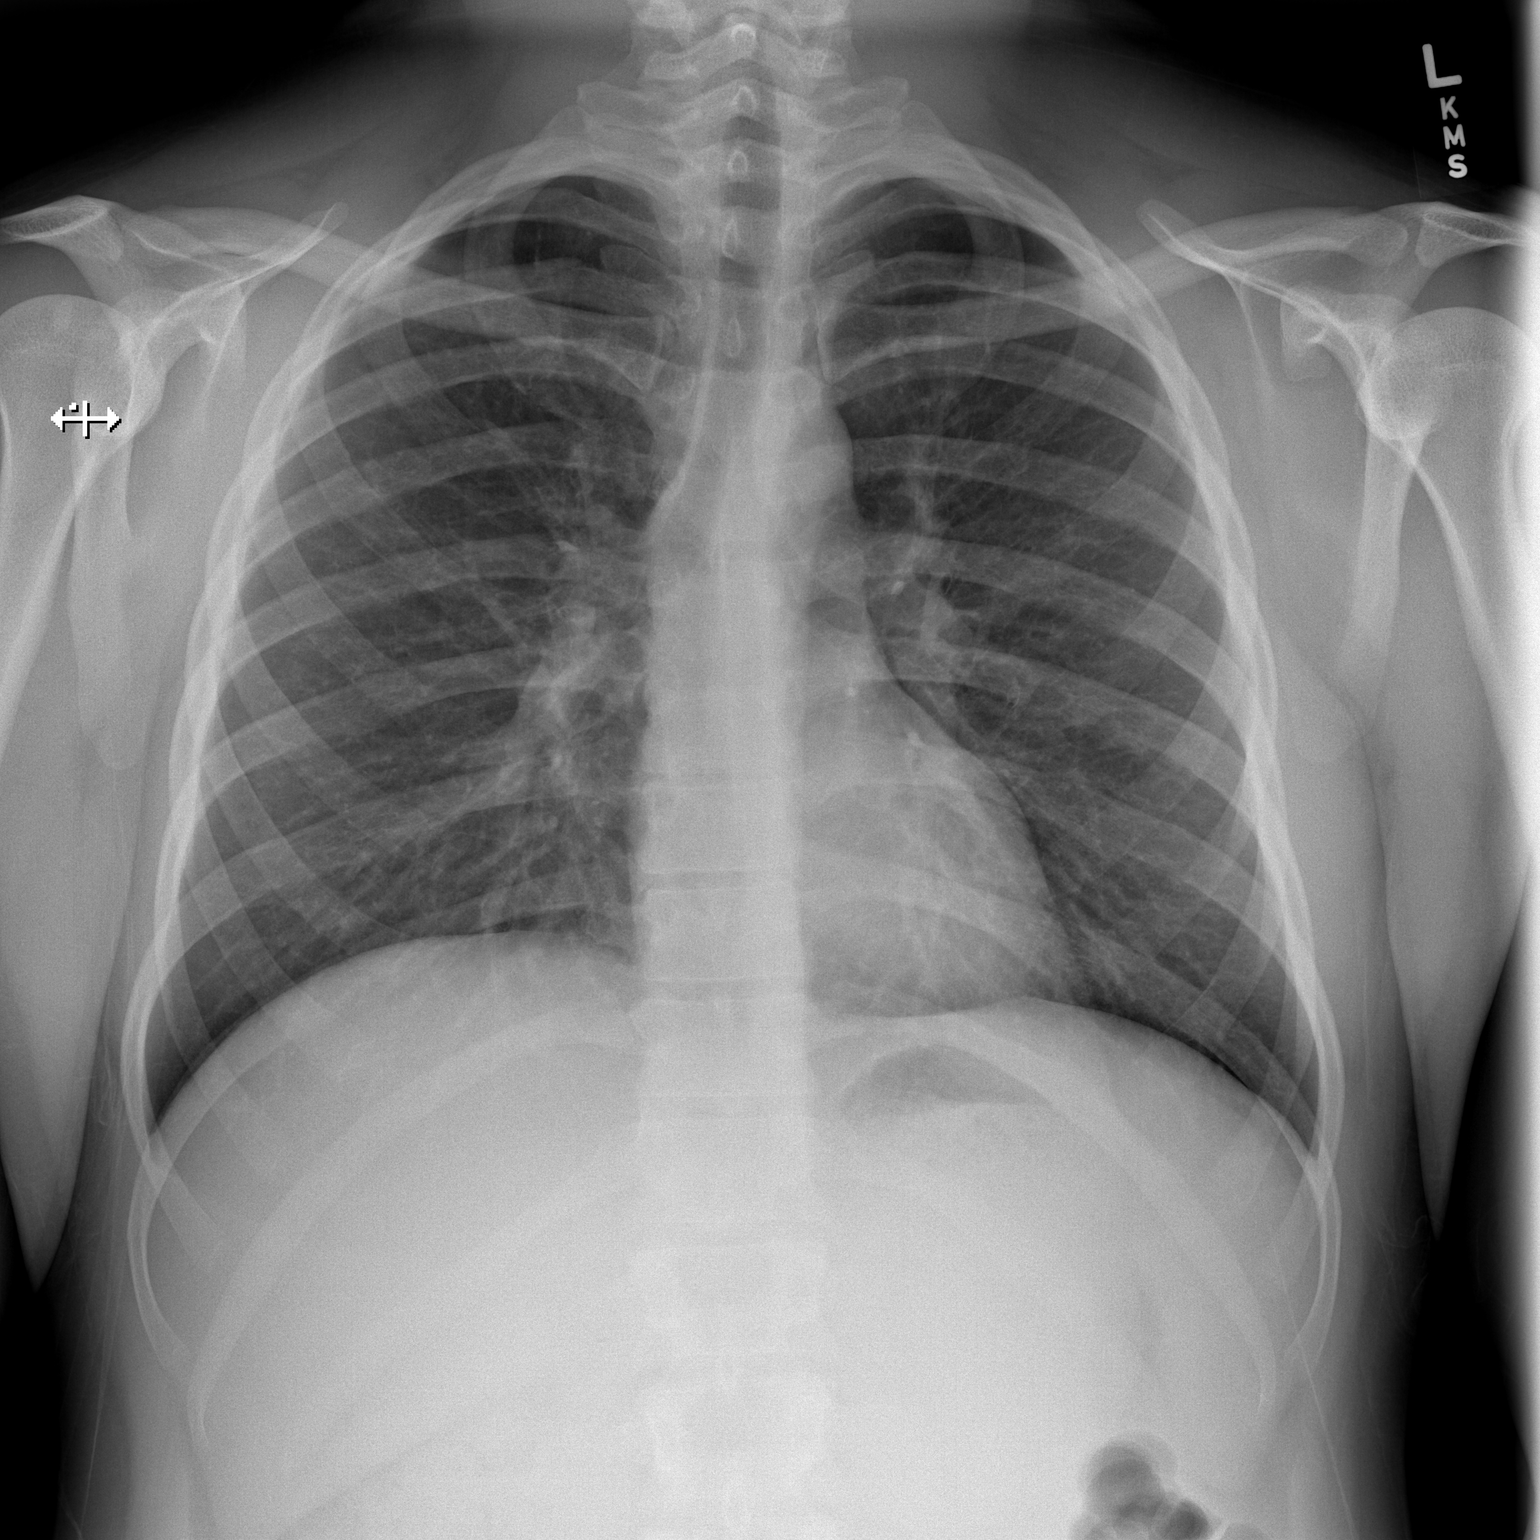

[w chest lat]
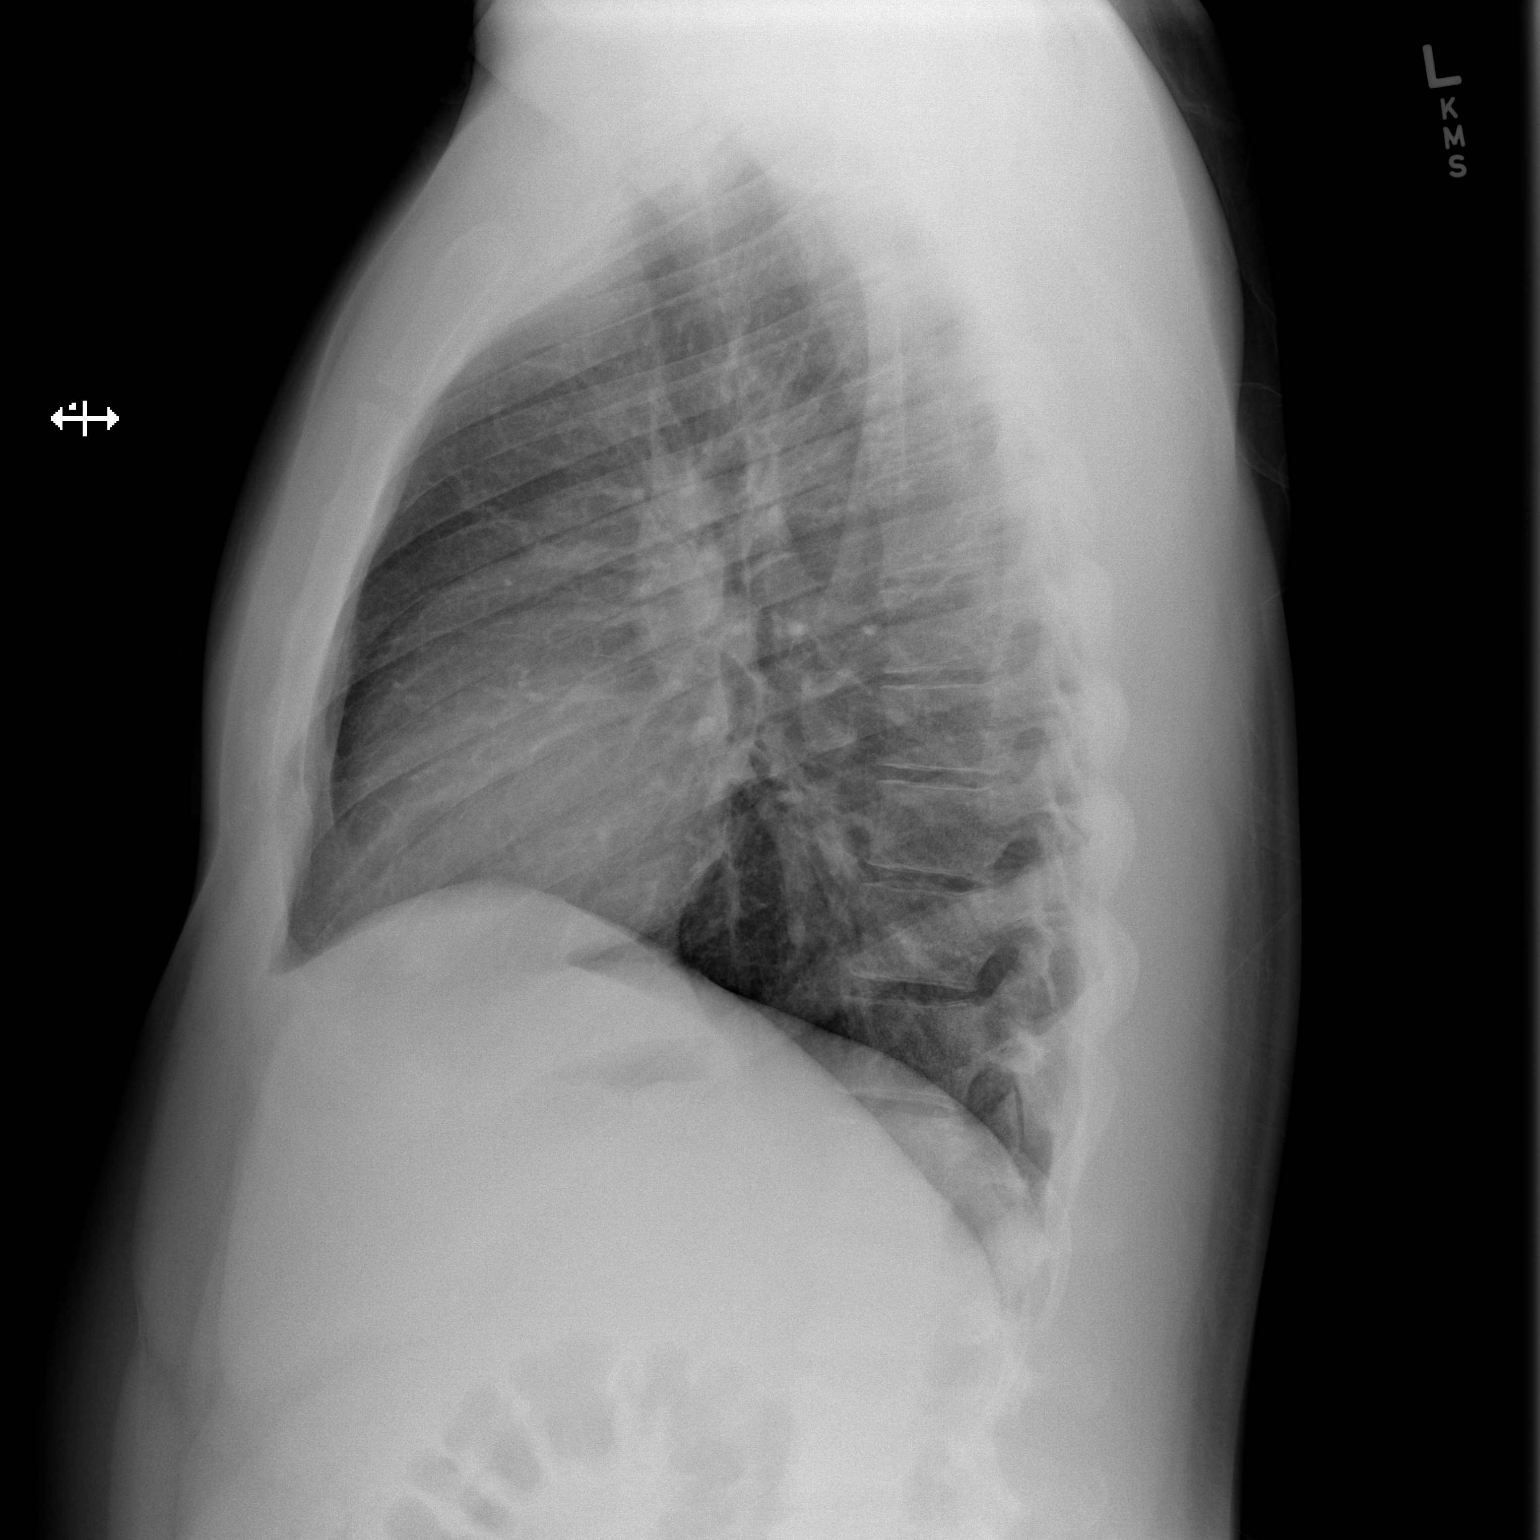

[2 of 2 positions shown; findings below may reference images not displayed]

FINDINGS: The heart size and pulmonary vascularity are normal. The
lungs appear clear and expanded without focal air space disease or
consolidation. No blunting of the costophrenic angles.  No
pneumothorax.  Mediastinal contours appear intact.  No significant
changes since previous study.
IMPRESSION: No evidence of active pulmonary disease.

## 2014-10-08 DIAGNOSIS — G43909 Migraine, unspecified, not intractable, without status migrainosus: Secondary | ICD-10-CM | POA: Insufficient documentation

## 2021-07-28 DIAGNOSIS — F17200 Nicotine dependence, unspecified, uncomplicated: Secondary | ICD-10-CM | POA: Insufficient documentation

## 2021-07-28 DIAGNOSIS — E7800 Pure hypercholesterolemia, unspecified: Secondary | ICD-10-CM | POA: Insufficient documentation

## 2021-07-28 DIAGNOSIS — F320 Major depressive disorder, single episode, mild: Secondary | ICD-10-CM | POA: Insufficient documentation

## 2021-07-28 DIAGNOSIS — I69359 Hemiplegia and hemiparesis following cerebral infarction affecting unspecified side: Secondary | ICD-10-CM | POA: Insufficient documentation

## 2021-07-28 DIAGNOSIS — F1511 Other stimulant abuse, in remission: Secondary | ICD-10-CM | POA: Insufficient documentation

## 2021-07-28 DIAGNOSIS — G4701 Insomnia due to medical condition: Secondary | ICD-10-CM | POA: Insufficient documentation

## 2021-07-28 DIAGNOSIS — R2681 Unsteadiness on feet: Secondary | ICD-10-CM | POA: Insufficient documentation

## 2022-08-25 DIAGNOSIS — R6889 Other general symptoms and signs: Secondary | ICD-10-CM | POA: Diagnosis not present

## 2022-10-04 DIAGNOSIS — R6889 Other general symptoms and signs: Secondary | ICD-10-CM | POA: Diagnosis not present

## 2022-10-04 DIAGNOSIS — R2681 Unsteadiness on feet: Secondary | ICD-10-CM | POA: Diagnosis not present

## 2022-10-04 DIAGNOSIS — I69359 Hemiplegia and hemiparesis following cerebral infarction affecting unspecified side: Secondary | ICD-10-CM | POA: Diagnosis not present

## 2022-10-07 DIAGNOSIS — I69951 Hemiplegia and hemiparesis following unspecified cerebrovascular disease affecting right dominant side: Secondary | ICD-10-CM | POA: Diagnosis not present

## 2022-10-07 DIAGNOSIS — I69920 Aphasia following unspecified cerebrovascular disease: Secondary | ICD-10-CM | POA: Diagnosis not present

## 2022-10-07 DIAGNOSIS — R6889 Other general symptoms and signs: Secondary | ICD-10-CM | POA: Diagnosis not present

## 2022-10-07 DIAGNOSIS — Z789 Other specified health status: Secondary | ICD-10-CM | POA: Diagnosis not present

## 2022-10-08 DIAGNOSIS — I69951 Hemiplegia and hemiparesis following unspecified cerebrovascular disease affecting right dominant side: Secondary | ICD-10-CM | POA: Diagnosis not present

## 2022-10-08 DIAGNOSIS — R6889 Other general symptoms and signs: Secondary | ICD-10-CM | POA: Diagnosis not present

## 2022-10-08 DIAGNOSIS — I69920 Aphasia following unspecified cerebrovascular disease: Secondary | ICD-10-CM | POA: Diagnosis not present

## 2022-10-08 DIAGNOSIS — Z789 Other specified health status: Secondary | ICD-10-CM | POA: Diagnosis not present

## 2022-10-14 DIAGNOSIS — E782 Mixed hyperlipidemia: Secondary | ICD-10-CM | POA: Diagnosis not present

## 2022-10-14 DIAGNOSIS — R6889 Other general symptoms and signs: Secondary | ICD-10-CM | POA: Diagnosis not present

## 2022-10-14 DIAGNOSIS — M21371 Foot drop, right foot: Secondary | ICD-10-CM | POA: Diagnosis not present

## 2022-10-14 DIAGNOSIS — I10 Essential (primary) hypertension: Secondary | ICD-10-CM | POA: Diagnosis not present

## 2022-10-14 DIAGNOSIS — I69391 Dysphagia following cerebral infarction: Secondary | ICD-10-CM | POA: Diagnosis not present

## 2022-10-14 DIAGNOSIS — I6930 Unspecified sequelae of cerebral infarction: Secondary | ICD-10-CM | POA: Diagnosis not present

## 2022-10-14 DIAGNOSIS — I69393 Ataxia following cerebral infarction: Secondary | ICD-10-CM | POA: Diagnosis not present

## 2022-11-01 ENCOUNTER — Emergency Department
Admission: EM | Admit: 2022-11-01 | Discharge: 2022-11-01 | Disposition: A | Discharge status: Home / Self Care | Payer: Medicare HMO | Attending: Emergency Medicine | Admitting: Emergency Medicine

## 2022-11-01 ENCOUNTER — Other Ambulatory Visit: Payer: Self-pay

## 2022-11-01 ENCOUNTER — Emergency Department: Payer: Medicare HMO

## 2022-11-01 DIAGNOSIS — R509 Fever, unspecified: Secondary | ICD-10-CM | POA: Diagnosis not present

## 2022-11-01 DIAGNOSIS — Z1152 Encounter for screening for COVID-19: Secondary | ICD-10-CM | POA: Insufficient documentation

## 2022-11-01 DIAGNOSIS — J101 Influenza due to other identified influenza virus with other respiratory manifestations: Secondary | ICD-10-CM | POA: Diagnosis not present

## 2022-11-01 DIAGNOSIS — R000 Tachycardia, unspecified: Secondary | ICD-10-CM | POA: Diagnosis not present

## 2022-11-01 DIAGNOSIS — R0689 Other abnormalities of breathing: Secondary | ICD-10-CM | POA: Diagnosis not present

## 2022-11-01 DIAGNOSIS — R112 Nausea with vomiting, unspecified: Secondary | ICD-10-CM | POA: Diagnosis not present

## 2022-11-01 DIAGNOSIS — I959 Hypotension, unspecified: Secondary | ICD-10-CM | POA: Diagnosis not present

## 2022-11-01 DIAGNOSIS — R059 Cough, unspecified: Secondary | ICD-10-CM | POA: Diagnosis not present

## 2022-11-01 LAB — RESP PANEL BY RT-PCR (RSV, FLU A&B, COVID)  RVPGX2
Influenza A by PCR: POSITIVE — AB
Influenza B by PCR: NEGATIVE
Resp Syncytial Virus by PCR: NEGATIVE
SARS Coronavirus 2 by RT PCR: NEGATIVE

## 2022-11-01 MED ORDER — ONDANSETRON 4 MG PO TBDP: 4.0000 mg | ORAL_TABLET | Freq: Three times a day (TID) | ORAL | 0 refills | Status: AC | PRN

## 2022-11-01 MED ORDER — ONDANSETRON 4 MG PO TBDP
4.0000 mg | ORAL_TABLET | Freq: Once | ORAL | Status: AC
  Administered 2022-11-01: 4 mg via ORAL
  Filled 2022-11-01: qty 1

## 2022-11-01 MED ORDER — KETOROLAC TROMETHAMINE 30 MG/ML IJ SOLN
30.0000 mg | Freq: Once | INTRAMUSCULAR | Status: AC
  Administered 2022-11-01: 30 mg via INTRAMUSCULAR
  Filled 2022-11-01: qty 1

## 2022-11-01 MED ORDER — ACETAMINOPHEN 500 MG PO TABS
1000.0000 mg | ORAL_TABLET | Freq: Once | ORAL | Status: AC
  Administered 2022-11-01: 1000 mg via ORAL
  Filled 2022-11-01: qty 2

## 2022-11-01 MED ORDER — ONDANSETRON 4 MG PO TBDP: 4.0000 mg | ORAL_TABLET | Freq: Three times a day (TID) | ORAL | 0 refills | Status: DC | PRN

## 2022-11-01 NOTE — ED Triage Notes (Signed)
First nurse note- To triage via [facility] with c/o N/V x 12 hours.

## 2022-11-01 NOTE — ED Notes (Signed)
Pt DC to home. DC instructions reviewed with all questions answered. Pt voices understanding. Pt remains in lobby attempting to phone family members for ride home.

## 2022-11-01 NOTE — ED Provider Notes (Signed)
[facility] Provider Note    None    (approximate)   History   Emesis   HPI  Jonathon Clay is a 36 y.o. male who presents to the ED for evaluation of Emesis   Patient presents to the ED by EMS from home for evaluation of 24 hours of cough, subjective fevers and chills, nausea and an episode of emesis.  Reports diffuse myalgias.  No known particular sick contacts.  Lives at home with his mom.  Despite initially being triaged as "abdominal pain" patient denies any focal pain such as abdominal pain or chest pain.  Does report a mild headache alongside diffuse myalgias.   Physical Exam   Triage Vital Signs: ED Triage Vitals  Enc Vitals Group     BP 11/01/22 0451 117/83     Pulse Rate 11/01/22 0451 (!) 121     Resp 11/01/22 0451 20     Temp 11/01/22 0451 98.7 F (37.1 C)     Temp Source 11/01/22 0451 Oral     SpO2 11/01/22 0451 96 %     Weight 11/01/22 0452 225 lb (102.1 kg)     Height 11/01/22 0452 5\' 10"  (1.778 m)     Head Circumference --      Peak Flow --      Pain Score --      Pain Loc --      Pain Edu? --      Excl. in GC? --     Most recent vital signs: Vitals:   11/01/22 0451 11/01/22 0603  BP: 117/83 131/61  Pulse: (!) 121 85  Resp: 20 18  Temp: 98.7 F (37.1 C) 98.2 F (36.8 C)  SpO2: 96% 98%    General: Awake, no distress.  Sitting upright in a wheelchair, warm to the touch.  Somewhat sweaty. CV:  Good peripheral perfusion.  Tachycardic and regular Resp:  Normal effort.  Clear lungs throughout without tachypnea Abd:  No distention.  Soft and benign throughout without any tenderness, peritoneal features or guarding. MSK:  No deformity noted.  Chronic brace to the posterior aspect of the right lower leg. Neuro:  No focal deficits appreciated. Other:     ED Results / Procedures / Treatments   Labs (all labs ordered are listed, but only abnormal results are displayed) Labs Reviewed  RESP PANEL BY RT-PCR (RSV, FLU A&B,  COVID)  RVPGX2 - Abnormal; Notable for the following components:      Result Value   Influenza A by PCR POSITIVE (*)    All other components within normal limits    EKG   RADIOLOGY CXR interpreted by me without evidence of acute cardiopulmonary pathology.  Official radiology report(s): DG Chest 2 View  Result Date: 11/01/2022 CLINICAL DATA:  Cough. EXAM: CHEST - 2 VIEW COMPARISON:  06/29/2013 FINDINGS: The lungs are clear without focal pneumonia, edema, pneumothorax or pleural effusion. The cardiopericardial silhouette is within normal limits for size. The visualized bony structures of the thorax are unremarkable. IMPRESSION: No active cardiopulmonary disease. Electronically Signed   By: 07/01/2013 M.D.   On: 11/01/2022 05:43    PROCEDURES and INTERVENTIONS:  Procedures  Medications  ketorolac (TORADOL) 30 MG/ML injection 30 mg (30 mg Intramuscular Given 11/01/22 0504)  acetaminophen (TYLENOL) tablet 1,000 mg (1,000 mg Oral Given 11/01/22 0504)  ondansetron (ZOFRAN-ODT) disintegrating tablet 4 mg (4 mg Oral Given 11/01/22 0504)     IMPRESSION / MDM / ASSESSMENT AND PLAN / ED COURSE  I reviewed the triage vital signs and the nursing notes.  Differential diagnosis includes, but is not limited to, influenza, COVID, pneumonia, appendicitis  {Patient presents with symptoms of an acute illness or injury that is potentially life-threatening.  36 year old male presents from home with about 1 day of illness, I suspect of viral etiology like influenza.  Will provide antiemetics and antipyretics.  Swab for viral etiologies and perform CXR.  Viral swab confirms influenza A.  CXR is clear.  Tachycardia resolved with antipyretics and antiemetics.  Will discharge with expectant management, Zofran prescription and return precautions      FINAL CLINICAL IMPRESSION(S) / ED DIAGNOSES   Final diagnoses:  Influenza A     Rx / DC Orders   ED Discharge Orders          Ordered     ondansetron (ZOFRAN-ODT) 4 MG disintegrating tablet  Every 8 hours PRN        11/01/22 0549             Note:  This document was prepared using Dragon voice recognition software and may include unintentional dictation errors.   Delton Prairie, MD 11/01/22 [phone]

## 2022-11-01 NOTE — ED Triage Notes (Signed)
Pt to ED via [facility] with c/o cough, N/V. Pt states 3 episodes of emesis today. Pt with hx of stroke. Pt noted to be diaphoretic in triage.    Dr. Katrinka Blazing in triage to assess patient.

## 2022-11-01 NOTE — Discharge Instructions (Addendum)
Please take Tylenol and ibuprofen/Advil for your pain.  It is safe to take them together, or to alternate them every few hours.  Take up to 1000mg  of Tylenol at a time, up to 4 times per day.  Do not take more than 4000 mg of Tylenol in 24 hours.  For ibuprofen, take 400-600 mg, 3 - 4 times per day.  Use the Zofran as needed for nausea and vomiting.  This will be waiting for you at the [facility] pharmacy.  You have influenza A.

## 2022-11-01 NOTE — ED Notes (Signed)
Pt attempted to call his mother three times to come pick him up with no answer. Pt left vm for mother.

## 2022-11-09 ENCOUNTER — Telehealth: Payer: Self-pay

## 2022-11-09 NOTE — Telephone Encounter (Signed)
        Patient  visited [place] on 12/25    Telephone encounter attempt : 1st  A HIPAA compliant voice message was left requesting a return call.  Instructed patient to call back .    [name] [facility], [place], Care Management  [phone] [address], [place], [place] [address] Phone: [phone] Email: [email or web address]       

## 2022-11-10 ENCOUNTER — Telehealth: Payer: Self-pay

## 2022-11-10 DIAGNOSIS — R6889 Other general symptoms and signs: Secondary | ICD-10-CM | POA: Diagnosis not present

## 2022-11-10 NOTE — Telephone Encounter (Signed)
        Patient  visited [place] on 12/25   Telephone encounter attempt :  2nd  A HIPAA compliant voice message was left requesting a return call.  Instructed patient to call back     Cotina Freedman [facility], [place], Care Management  [phone] [address], [place],  [address] Phone: [phone] Email: [email or web address]       

## 2023-01-13 ENCOUNTER — Ambulatory Visit (INDEPENDENT_AMBULATORY_CARE_PROVIDER_SITE_OTHER): Payer: Medicare HMO | Admitting: Family

## 2023-01-13 VITALS — BP 128/78 | HR 100 | Ht 70.0 in | Wt 233.0 lb

## 2023-01-13 DIAGNOSIS — E782 Mixed hyperlipidemia: Secondary | ICD-10-CM | POA: Diagnosis not present

## 2023-01-13 DIAGNOSIS — I69359 Hemiplegia and hemiparesis following cerebral infarction affecting unspecified side: Secondary | ICD-10-CM | POA: Diagnosis not present

## 2023-01-13 DIAGNOSIS — R6889 Other general symptoms and signs: Secondary | ICD-10-CM | POA: Diagnosis not present

## 2023-01-14 LAB — CBC WITH DIFFERENTIAL
Basophils Absolute: 0.1 10*3/uL (ref 0.0–0.2)
Basos: 1 %
EOS (ABSOLUTE): 0.1 10*3/uL (ref 0.0–0.4)
Eos: 1 %
Hematocrit: 47.8 % (ref 37.5–51.0)
Hemoglobin: 16.3 g/dL (ref 13.0–17.7)
Immature Grans (Abs): 0.1 10*3/uL (ref 0.0–0.1)
Immature Granulocytes: 1 %
Lymphocytes Absolute: 1.6 10*3/uL (ref 0.7–3.1)
Lymphs: 16 %
MCH: 31.3 pg (ref 26.6–33.0)
MCHC: 34.1 g/dL (ref 31.5–35.7)
MCV: 92 fL (ref 79–97)
Monocytes Absolute: 0.8 10*3/uL (ref 0.1–0.9)
Monocytes: 8 %
Neutrophils Absolute: 7.4 10*3/uL — ABNORMAL HIGH (ref 1.4–7.0)
Neutrophils: 73 %
RBC: 5.21 x10E6/uL (ref 4.14–5.80)
RDW: 13.1 % (ref 11.6–15.4)
WBC: 9.9 10*3/uL (ref 3.4–10.8)

## 2023-01-14 LAB — CMP14+EGFR
ALT: 29 IU/L (ref 0–44)
AST: 19 IU/L (ref 0–40)
Albumin/Globulin Ratio: 2 (ref 1.2–2.2)
Albumin: 4.6 g/dL (ref 4.1–5.1)
Alkaline Phosphatase: 76 IU/L (ref 44–121)
BUN/Creatinine Ratio: 16 (ref 9–20)
BUN: 14 mg/dL (ref 6–20)
Bilirubin Total: 0.3 mg/dL (ref 0.0–1.2)
CO2: 20 mmol/L (ref 20–29)
Calcium: 9.1 mg/dL (ref 8.7–10.2)
Chloride: 105 mmol/L (ref 96–106)
Creatinine, Ser: 0.87 mg/dL (ref 0.76–1.27)
Globulin, Total: 2.3 g/dL (ref 1.5–4.5)
Glucose: 106 mg/dL — ABNORMAL HIGH (ref 70–99)
Potassium: 3.9 mmol/L (ref 3.5–5.2)
Sodium: 147 mmol/L — ABNORMAL HIGH (ref 134–144)
Total Protein: 6.9 g/dL (ref 6.0–8.5)
eGFR: 116 mL/min/{1.73_m2} (ref >59)

## 2023-01-14 LAB — LIPID PANEL
Chol/HDL Ratio: 4.6 ratio (ref 0.0–5.0)
Cholesterol, Total: 138 mg/dL (ref 100–199)
HDL: 30 mg/dL — ABNORMAL LOW (ref >39)
LDL Chol Calc (NIH): 62 mg/dL (ref 0–99)
Triglycerides: 287 mg/dL — ABNORMAL HIGH (ref 0–149)
VLDL Cholesterol Cal: 46 mg/dL — ABNORMAL HIGH (ref 5–40)

## 2023-01-15 ENCOUNTER — Encounter: Payer: Self-pay | Admitting: Family

## 2023-01-15 NOTE — Progress Notes (Signed)
Established Patient Office Visit  Subjective:  Patient ID: Jonathon Clay, male    DOB: 12/19/1985  Age: 37 y.o. MRN: [id number]  Chief Complaint  Patient presents with   Follow-up    3 month follow up    Patient is here today for his 3 month f/u. He also needs referral for inpatient rehab.  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 His gait is abnormal, and he has difficulty performing ADL's as a result. He also has general difficulties with movement and also some with emotional regulation.   He is feeling well in general No other concerns at this time.      Past Medical History:  Diagnosis Date   Migraine     Past Surgical History:  Procedure Laterality Date   HYPOSPADIAS CORRECTION      Social History   Socioeconomic History   Marital status: Single    Spouse name: Not on file   Number of children: Not on file   Years of education: Not on file   Highest education level: Not on file  Occupational History   Not on file  Tobacco Use   Smoking status: Every Day    Types: Cigarettes   Smokeless tobacco: Not on file  Substance and Sexual Activity   Alcohol use: Yes    Comment: occ   Drug use: No   Sexual activity: Not on file  Other Topics Concern   Not on file  Social History Narrative   Not on file   Social Determinants of Health   Financial Resource Strain: Not on file  Food Insecurity: Not on file  Transportation Needs: Not on file  Physical Activity: Not on file  Stress: Not on file  Social Connections: Not on file  Intimate Partner Violence: Not on file    No family history on file.  Allergies  Allergen Reactions   Ketorolac Tromethamine Nausea Only    Review of Systems  Constitutional: Negative.   HENT: Negative.    Eyes: Negative.   Respiratory: Negative.    Cardiovascular: Negative.    Gastrointestinal: Negative.   Genitourinary: Negative.   Musculoskeletal:        Right sided weakness  Skin: Negative.   Neurological:  Positive for speech change and weakness. Loss of consciousness: right sided weakness 2/2 stroke. Psychiatric/Behavioral:  Positive for depression.   All other systems reviewed and are negative.      Objective:   BP 128/78   Pulse 100   Ht '5\' 10"'$  (1.778 m)   Wt 233 lb (105.7 kg)   SpO2 98%   BMI 33.43 kg/m   Vitals:   01/13/23 1104  BP: 128/78  Pulse: 100  Height: '5\' 10"'$  (1.778 m)  Weight: 233 lb (105.7 kg)  SpO2: 98%  BMI (Calculated): 33.43    Physical Exam Constitutional:      General: He is not in acute distress.    Appearance: Normal appearance. He is obese.  HENT:     Head: Normocephalic and atraumatic.  Eyes:     Extraocular Movements: Extraocular movements intact.     Pupils: Pupils are equal, round, and reactive to light.  Cardiovascular:     Rate and Rhythm: Normal rate and regular rhythm.  Pulmonary:     Effort: Pulmonary effort is normal.  Breath sounds: Normal breath sounds.  Skin:    General: Skin is warm and dry.     Capillary Refill: Capillary refill takes less than 2 seconds.  Neurological:     Mental Status: He is alert and oriented to person, place, and time. Mental status is at baseline.     GCS: GCS eye subscore is 4. GCS verbal subscore is 5. GCS motor subscore is 6.     Motor: Weakness present.     Coordination: Coordination abnormal.     Gait: Gait abnormal.     Deep Tendon Reflexes: Reflexes are normal and symmetric.  Psychiatric:        Attention and Perception: Attention and perception normal.        Mood and Affect: Affect is flat.        Behavior: Behavior is agitated.        Judgment: Judgment is impulsive.      Results for orders placed or performed in visit on 01/13/23  CBC With Differential  Result Value Ref Range   WBC 9.9 3.4 - 10.8 x10E3/uL   RBC 5.21 4.14 - 5.80 x10E6/uL    Hemoglobin 16.3 13.0 - 17.7 g/dL   Hematocrit 47.8 37.5 - 51.0 %   MCV 92 79 - 97 fL   MCH 31.3 26.6 - 33.0 pg   MCHC 34.1 31.5 - 35.7 g/dL   RDW 13.1 11.6 - 15.4 %   Neutrophils 73 Not Estab. %   Lymphs 16 Not Estab. %   Monocytes 8 Not Estab. %   Eos 1 Not Estab. %   Basos 1 Not Estab. %   Neutrophils Absolute 7.4 (H) 1.4 - 7.0 x10E3/uL   Lymphocytes Absolute 1.6 0.7 - 3.1 x10E3/uL   Monocytes Absolute 0.8 0.1 - 0.9 x10E3/uL   EOS (ABSOLUTE) 0.1 0.0 - 0.4 x10E3/uL   Basophils Absolute 0.1 0.0 - 0.2 x10E3/uL   Immature Granulocytes 1 Not Estab. %   Immature Grans (Abs) 0.1 0.0 - 0.1 x10E3/uL  CMP14+EGFR  Result Value Ref Range   Glucose 106 (H) 70 - 99 mg/dL   BUN 14 6 - 20 mg/dL   Creatinine, Ser 0.87 0.76 - 1.27 mg/dL   eGFR 116 >59 mL/min/1.73   BUN/Creatinine Ratio 16 9 - 20   Sodium 147 (H) 134 - 144 mmol/L   Potassium 3.9 3.5 - 5.2 mmol/L   Chloride 105 96 - 106 mmol/L   CO2 20 20 - 29 mmol/L   Calcium 9.1 8.7 - 10.2 mg/dL   Total Protein 6.9 6.0 - 8.5 g/dL   Albumin 4.6 4.1 - 5.1 g/dL   Globulin, Total 2.3 1.5 - 4.5 g/dL   Albumin/Globulin Ratio 2.0 1.2 - 2.2   Bilirubin Total 0.3 0.0 - 1.2 mg/dL   Alkaline Phosphatase 76 44 - 121 IU/L   AST 19 0 - 40 IU/L   ALT 29 0 - 44 IU/L  Lipid panel  Result Value Ref Range   Cholesterol, Total 138 100 - 199 mg/dL   Triglycerides 287 (H) 0 - 149 mg/dL   HDL 30 (L) >39 mg/dL   VLDL Cholesterol Cal 46 (H) 5 - 40 mg/dL   LDL Chol Calc (NIH) 62 0 - 99 mg/dL   Chol/HDL Ratio 4.6 0.0 - 5.0 ratio    Recent Results (from the past 2160 hour(s))  Resp panel by RT-PCR (RSV, Flu A&B, Covid) Anterior Nasal Swab     Status: Abnormal   Collection Time: 11/01/22  4:57 AM  Specimen: Anterior Nasal Swab  Result Value Ref Range   SARS Coronavirus 2 by RT PCR NEGATIVE NEGATIVE    Comment: (NOTE) SARS-CoV-2 target nucleic acids are NOT DETECTED.  The SARS-CoV-2 RNA is generally detectable in upper respiratory specimens during the  acute phase of infection. The lowest concentration of SARS-CoV-2 viral copies this assay can detect is 138 copies/mL. A negative result does not preclude SARS-Cov-2 infection and should not be used as the sole basis for treatment or other patient management decisions. A negative result may occur with  improper specimen collection/handling, submission of specimen other than nasopharyngeal swab, presence of viral mutation(s) within the areas targeted by this assay, and inadequate number of viral copies(<138 copies/mL). A negative result must be combined with clinical observations, patient history, and epidemiological information. The expected result is Negative.  Fact Sheet for Patients:  EntrepreneurPulse.com.au  Fact Sheet for Healthcare Providers:  IncredibleEmployment.be  This test is no t yet approved or cleared by the [place] FDA and  has been authorized for detection and/or diagnosis of SARS-CoV-2 by FDA under an Emergency Use Authorization (EUA). This EUA will remain  in effect (meaning this test can be used) for the duration of the COVID-19 declaration under Section 564(b)(1) of the Act, 21 U.S.C.section 360bbb-3(b)(1), unless the authorization is terminated  or revoked sooner.       Influenza A by PCR POSITIVE (A) NEGATIVE   Influenza B by PCR NEGATIVE NEGATIVE    Comment: (NOTE) The Xpert Xpress SARS-CoV-2/FLU/RSV plus assay is intended as an aid in the diagnosis of influenza from Nasopharyngeal swab specimens and should not be used as a sole basis for treatment. Nasal washings and aspirates are unacceptable for Xpert Xpress SARS-CoV-2/FLU/RSV testing.  Fact Sheet for Patients: EntrepreneurPulse.com.au  Fact Sheet for Healthcare Providers: IncredibleEmployment.be  This test is not yet approved or cleared by the [place] FDA and has been authorized for detection and/or diagnosis of  SARS-CoV-2 by FDA under an Emergency Use Authorization (EUA). This EUA will remain in effect (meaning this test can be used) for the duration of the COVID-19 declaration under Section 564(b)(1) of the Act, 21 U.S.C. section 360bbb-3(b)(1), unless the authorization is terminated or revoked.     Resp Syncytial Virus by PCR NEGATIVE NEGATIVE    Comment: (NOTE) Fact Sheet for Patients: EntrepreneurPulse.com.au  Fact Sheet for Healthcare Providers: IncredibleEmployment.be  This test is not yet approved or cleared by the [place] FDA and has been authorized for detection and/or diagnosis of SARS-CoV-2 by FDA under an Emergency Use Authorization (EUA). This EUA will remain in effect (meaning this test can be used) for the duration of the COVID-19 declaration under Section 564(b)(1) of the Act, 21 U.S.C. section 360bbb-3(b)(1), unless the authorization is terminated or revoked.  Performed at [facility], [address] [place], [place] [address]   CBC With Differential     Status: Abnormal   Collection Time: 01/13/23  3:55 PM  Result Value Ref Range   WBC 9.9 3.4 - 10.8 x10E3/uL   RBC 5.21 4.14 - 5.80 x10E6/uL   Hemoglobin 16.3 13.0 - 17.7 g/dL   Hematocrit 47.8 37.5 - 51.0 %   MCV 92 79 - 97 fL   MCH 31.3 26.6 - 33.0 pg   MCHC 34.1 31.5 - 35.7 g/dL   RDW 13.1 11.6 - 15.4 %   Neutrophils 73 Not Estab. %   Lymphs 16 Not Estab. %   Monocytes 8 Not Estab. %   Eos 1  Not Estab. %   Basos 1 Not Estab. %   Neutrophils Absolute 7.4 (H) 1.4 - 7.0 x10E3/uL   Lymphocytes Absolute 1.6 0.7 - 3.1 x10E3/uL   Monocytes Absolute 0.8 0.1 - 0.9 x10E3/uL   EOS (ABSOLUTE) 0.1 0.0 - 0.4 x10E3/uL   Basophils Absolute 0.1 0.0 - 0.2 x10E3/uL   Immature Granulocytes 1 Not Estab. %   Immature Grans (Abs) 0.1 0.0 - 0.1 x10E3/uL  CMP14+EGFR     Status: Abnormal   Collection Time: 01/13/23  3:55 PM  Result Value Ref Range   Glucose 106 (H) 70 - 99  mg/dL   BUN 14 6 - 20 mg/dL   Creatinine, Ser 0.87 0.76 - 1.27 mg/dL   eGFR 116 >59 mL/min/1.73   BUN/Creatinine Ratio 16 9 - 20   Sodium 147 (H) 134 - 144 mmol/L   Potassium 3.9 3.5 - 5.2 mmol/L   Chloride 105 96 - 106 mmol/L   CO2 20 20 - 29 mmol/L   Calcium 9.1 8.7 - 10.2 mg/dL   Total Protein 6.9 6.0 - 8.5 g/dL   Albumin 4.6 4.1 - 5.1 g/dL   Globulin, Total 2.3 1.5 - 4.5 g/dL   Albumin/Globulin Ratio 2.0 1.2 - 2.2   Bilirubin Total 0.3 0.0 - 1.2 mg/dL   Alkaline Phosphatase 76 44 - 121 IU/L   AST 19 0 - 40 IU/L   ALT 29 0 - 44 IU/L  Lipid panel     Status: Abnormal   Collection Time: 01/13/23  3:55 PM  Result Value Ref Range   Cholesterol, Total 138 100 - 199 mg/dL   Triglycerides 287 (H) 0 - 149 mg/dL   HDL 30 (L) >39 mg/dL   VLDL Cholesterol Cal 46 (H) 5 - 40 mg/dL   LDL Chol Calc (NIH) 62 0 - 99 mg/dL   Chol/HDL Ratio 4.6 0.0 - 5.0 ratio    Comment:                                   T. Chol/HDL Ratio                                             Men  Women                               1/2 Avg.Risk  3.4    3.3                                   Avg.Risk  5.0    4.4                                2X Avg.Risk  9.6    7.1                                3X Avg.Risk 23.4   11.0       Assessment & Plan:   Problem List Items Addressed This Visit     CVA, old, hemiparesis ([place])   Other Visit Diagnoses  Mixed hyperlipidemia    -  Primary   Relevant Medications   rosuvastatin (CRESTOR) 20 MG tablet   Other Relevant Orders   CBC With Differential (Completed)   CMP14+EGFR (Completed)   Lipid panel (Completed)       No follow-ups on file.   Total time spent: 30 minutes  Mechele Claude, FNP  01/13/2023

## 2023-01-19 ENCOUNTER — Telehealth: Payer: Self-pay

## 2023-01-19 NOTE — Telephone Encounter (Signed)
Pt's mom called and left vm regarding referral to [facility] physical medicine and rehab on [address] in [place] for pt, so they can restart in patient therapy for OT, PT & speech. Please advise

## 2023-03-04 DIAGNOSIS — R2681 Unsteadiness on feet: Secondary | ICD-10-CM | POA: Diagnosis not present

## 2023-03-04 DIAGNOSIS — I69359 Hemiplegia and hemiparesis following cerebral infarction affecting unspecified side: Secondary | ICD-10-CM | POA: Diagnosis not present

## 2023-03-04 DIAGNOSIS — R6889 Other general symptoms and signs: Secondary | ICD-10-CM | POA: Diagnosis not present

## 2023-03-15 ENCOUNTER — Ambulatory Visit: Payer: Medicare HMO | Admitting: Family

## 2023-03-21 DIAGNOSIS — E7800 Pure hypercholesterolemia, unspecified: Secondary | ICD-10-CM | POA: Diagnosis not present

## 2023-03-21 DIAGNOSIS — Z720 Tobacco use: Secondary | ICD-10-CM | POA: Diagnosis not present

## 2023-03-21 DIAGNOSIS — I69351 Hemiplegia and hemiparesis following cerebral infarction affecting right dominant side: Secondary | ICD-10-CM | POA: Diagnosis not present

## 2023-03-21 DIAGNOSIS — G4701 Insomnia due to medical condition: Secondary | ICD-10-CM | POA: Diagnosis not present

## 2023-03-28 DIAGNOSIS — E7800 Pure hypercholesterolemia, unspecified: Secondary | ICD-10-CM | POA: Diagnosis not present

## 2023-03-28 DIAGNOSIS — G4701 Insomnia due to medical condition: Secondary | ICD-10-CM | POA: Diagnosis not present

## 2023-03-28 DIAGNOSIS — I69359 Hemiplegia and hemiparesis following cerebral infarction affecting unspecified side: Secondary | ICD-10-CM | POA: Diagnosis not present

## 2023-03-28 DIAGNOSIS — G8111 Spastic hemiplegia affecting right dominant side: Secondary | ICD-10-CM | POA: Diagnosis not present

## 2023-03-28 DIAGNOSIS — F17200 Nicotine dependence, unspecified, uncomplicated: Secondary | ICD-10-CM | POA: Diagnosis not present

## 2023-03-28 DIAGNOSIS — I69351 Hemiplegia and hemiparesis following cerebral infarction affecting right dominant side: Secondary | ICD-10-CM | POA: Diagnosis not present

## 2023-03-28 DIAGNOSIS — R2681 Unsteadiness on feet: Secondary | ICD-10-CM | POA: Diagnosis not present

## 2023-04-15 ENCOUNTER — Ambulatory Visit: Payer: 59 | Admitting: Family

## 2023-04-18 DIAGNOSIS — R2689 Other abnormalities of gait and mobility: Secondary | ICD-10-CM | POA: Diagnosis not present

## 2023-04-18 DIAGNOSIS — I69398 Other sequelae of cerebral infarction: Secondary | ICD-10-CM | POA: Diagnosis not present

## 2023-05-30 DIAGNOSIS — F17200 Nicotine dependence, unspecified, uncomplicated: Secondary | ICD-10-CM | POA: Diagnosis not present

## 2023-05-30 DIAGNOSIS — I69351 Hemiplegia and hemiparesis following cerebral infarction affecting right dominant side: Secondary | ICD-10-CM | POA: Diagnosis not present

## 2023-06-30 DIAGNOSIS — F17200 Nicotine dependence, unspecified, uncomplicated: Secondary | ICD-10-CM | POA: Diagnosis not present

## 2023-06-30 DIAGNOSIS — G4701 Insomnia due to medical condition: Secondary | ICD-10-CM | POA: Diagnosis not present

## 2023-06-30 DIAGNOSIS — I69351 Hemiplegia and hemiparesis following cerebral infarction affecting right dominant side: Secondary | ICD-10-CM | POA: Diagnosis not present

## 2023-07-01 DIAGNOSIS — I69351 Hemiplegia and hemiparesis following cerebral infarction affecting right dominant side: Secondary | ICD-10-CM | POA: Diagnosis not present

## 2023-07-01 DIAGNOSIS — G4701 Insomnia due to medical condition: Secondary | ICD-10-CM | POA: Diagnosis not present

## 2023-07-01 DIAGNOSIS — R451 Restlessness and agitation: Secondary | ICD-10-CM | POA: Diagnosis not present

## 2023-07-01 DIAGNOSIS — R2681 Unsteadiness on feet: Secondary | ICD-10-CM | POA: Diagnosis not present

## 2023-07-04 DIAGNOSIS — I69359 Hemiplegia and hemiparesis following cerebral infarction affecting unspecified side: Secondary | ICD-10-CM | POA: Diagnosis not present

## 2023-07-04 DIAGNOSIS — I69398 Other sequelae of cerebral infarction: Secondary | ICD-10-CM | POA: Diagnosis not present

## 2023-07-04 DIAGNOSIS — R2689 Other abnormalities of gait and mobility: Secondary | ICD-10-CM | POA: Diagnosis not present

## 2023-07-25 DIAGNOSIS — I69398 Other sequelae of cerebral infarction: Secondary | ICD-10-CM | POA: Diagnosis not present

## 2023-07-25 DIAGNOSIS — R2689 Other abnormalities of gait and mobility: Secondary | ICD-10-CM | POA: Diagnosis not present

## 2023-07-25 DIAGNOSIS — I69359 Hemiplegia and hemiparesis following cerebral infarction affecting unspecified side: Secondary | ICD-10-CM | POA: Diagnosis not present

## 2023-08-04 DIAGNOSIS — R2689 Other abnormalities of gait and mobility: Secondary | ICD-10-CM | POA: Diagnosis not present

## 2023-08-04 DIAGNOSIS — I69398 Other sequelae of cerebral infarction: Secondary | ICD-10-CM | POA: Diagnosis not present

## 2023-08-04 DIAGNOSIS — I69359 Hemiplegia and hemiparesis following cerebral infarction affecting unspecified side: Secondary | ICD-10-CM | POA: Diagnosis not present

## 2023-08-05 DIAGNOSIS — Z7409 Other reduced mobility: Secondary | ICD-10-CM | POA: Diagnosis not present

## 2023-08-05 DIAGNOSIS — R2681 Unsteadiness on feet: Secondary | ICD-10-CM | POA: Diagnosis not present

## 2023-08-05 DIAGNOSIS — I69359 Hemiplegia and hemiparesis following cerebral infarction affecting unspecified side: Secondary | ICD-10-CM | POA: Diagnosis not present

## 2023-08-15 DIAGNOSIS — I69398 Other sequelae of cerebral infarction: Secondary | ICD-10-CM | POA: Diagnosis not present

## 2023-08-15 DIAGNOSIS — R2689 Other abnormalities of gait and mobility: Secondary | ICD-10-CM | POA: Diagnosis not present

## 2023-08-15 DIAGNOSIS — I612 Nontraumatic intracerebral hemorrhage in hemisphere, unspecified: Secondary | ICD-10-CM | POA: Diagnosis not present

## 2023-08-15 DIAGNOSIS — I69359 Hemiplegia and hemiparesis following cerebral infarction affecting unspecified side: Secondary | ICD-10-CM | POA: Diagnosis not present

## 2023-08-15 DIAGNOSIS — R29818 Other symptoms and signs involving the nervous system: Secondary | ICD-10-CM | POA: Diagnosis not present

## 2023-08-15 DIAGNOSIS — R252 Cramp and spasm: Secondary | ICD-10-CM | POA: Diagnosis not present

## 2023-08-15 DIAGNOSIS — R29898 Other symptoms and signs involving the musculoskeletal system: Secondary | ICD-10-CM | POA: Diagnosis not present

## 2023-08-26 DIAGNOSIS — R29818 Other symptoms and signs involving the nervous system: Secondary | ICD-10-CM | POA: Diagnosis not present

## 2023-08-26 DIAGNOSIS — R2689 Other abnormalities of gait and mobility: Secondary | ICD-10-CM | POA: Diagnosis not present

## 2023-08-26 DIAGNOSIS — I612 Nontraumatic intracerebral hemorrhage in hemisphere, unspecified: Secondary | ICD-10-CM | POA: Diagnosis not present

## 2023-08-26 DIAGNOSIS — R252 Cramp and spasm: Secondary | ICD-10-CM | POA: Diagnosis not present

## 2023-08-26 DIAGNOSIS — R29898 Other symptoms and signs involving the musculoskeletal system: Secondary | ICD-10-CM | POA: Diagnosis not present

## 2023-08-26 DIAGNOSIS — I69359 Hemiplegia and hemiparesis following cerebral infarction affecting unspecified side: Secondary | ICD-10-CM | POA: Diagnosis not present

## 2023-08-26 DIAGNOSIS — I69398 Other sequelae of cerebral infarction: Secondary | ICD-10-CM | POA: Diagnosis not present

## 2023-08-29 DIAGNOSIS — R252 Cramp and spasm: Secondary | ICD-10-CM | POA: Diagnosis not present

## 2023-08-29 DIAGNOSIS — I69359 Hemiplegia and hemiparesis following cerebral infarction affecting unspecified side: Secondary | ICD-10-CM | POA: Diagnosis not present

## 2023-08-29 DIAGNOSIS — I612 Nontraumatic intracerebral hemorrhage in hemisphere, unspecified: Secondary | ICD-10-CM | POA: Diagnosis not present

## 2023-08-29 DIAGNOSIS — R2689 Other abnormalities of gait and mobility: Secondary | ICD-10-CM | POA: Diagnosis not present

## 2023-08-29 DIAGNOSIS — R29898 Other symptoms and signs involving the musculoskeletal system: Secondary | ICD-10-CM | POA: Diagnosis not present

## 2023-08-29 DIAGNOSIS — I69398 Other sequelae of cerebral infarction: Secondary | ICD-10-CM | POA: Diagnosis not present

## 2023-08-29 DIAGNOSIS — R29818 Other symptoms and signs involving the nervous system: Secondary | ICD-10-CM | POA: Diagnosis not present

## 2023-09-11 ENCOUNTER — Other Ambulatory Visit: Payer: Self-pay

## 2023-09-11 ENCOUNTER — Emergency Department: Payer: 59

## 2023-09-11 ENCOUNTER — Encounter: Payer: Self-pay | Admitting: Emergency Medicine

## 2023-09-11 ENCOUNTER — Emergency Department
Admission: EM | Admit: 2023-09-11 | Discharge: 2023-09-11 | Disposition: A | Discharge status: Home / Self Care | Payer: 59 | Attending: Emergency Medicine | Admitting: Emergency Medicine

## 2023-09-11 DIAGNOSIS — S0990XA Unspecified injury of head, initial encounter: Secondary | ICD-10-CM | POA: Diagnosis not present

## 2023-09-11 DIAGNOSIS — G9389 Other specified disorders of brain: Secondary | ICD-10-CM | POA: Diagnosis not present

## 2023-09-11 DIAGNOSIS — Y900 Blood alcohol level of less than 20 mg/100 ml: Secondary | ICD-10-CM | POA: Diagnosis not present

## 2023-09-11 DIAGNOSIS — R69 Illness, unspecified: Secondary | ICD-10-CM | POA: Diagnosis not present

## 2023-09-11 DIAGNOSIS — F13129 Sedative, hypnotic or anxiolytic abuse with intoxication, unspecified: Secondary | ICD-10-CM | POA: Diagnosis not present

## 2023-09-11 DIAGNOSIS — S199XXA Unspecified injury of neck, initial encounter: Secondary | ICD-10-CM | POA: Diagnosis not present

## 2023-09-11 DIAGNOSIS — Z982 Presence of cerebrospinal fluid drainage device: Secondary | ICD-10-CM | POA: Diagnosis not present

## 2023-09-11 DIAGNOSIS — F12129 Cannabis abuse with intoxication, unspecified: Secondary | ICD-10-CM | POA: Diagnosis not present

## 2023-09-11 DIAGNOSIS — S0101XA Laceration without foreign body of scalp, initial encounter: Secondary | ICD-10-CM | POA: Diagnosis not present

## 2023-09-11 DIAGNOSIS — F19929 Other psychoactive substance use, unspecified with intoxication, unspecified: Secondary | ICD-10-CM

## 2023-09-11 LAB — COMPREHENSIVE METABOLIC PANEL
ALT: 25 U/L (ref 0–44)
AST: 22 U/L (ref 15–41)
Albumin: 4.9 g/dL (ref 3.5–5.0)
Alkaline Phosphatase: 63 U/L (ref 38–126)
Anion gap: 11 (ref 5–15)
BUN: 16 mg/dL (ref 6–20)
CO2: 24 mmol/L (ref 22–32)
Calcium: 9.2 mg/dL (ref 8.9–10.3)
Chloride: 106 mmol/L (ref 98–111)
Creatinine, Ser: 0.95 mg/dL (ref 0.61–1.24)
GFR, Estimated: >60 mL/min (ref >60)
Glucose, Bld: 110 mg/dL — ABNORMAL HIGH (ref 70–99)
Potassium: 4.2 mmol/L (ref 3.5–5.1)
Sodium: 141 mmol/L (ref 135–145)
Total Bilirubin: 0.5 mg/dL (ref 0.3–1.2)
Total Protein: 8 g/dL (ref 6.5–8.1)

## 2023-09-11 LAB — CBC WITH DIFFERENTIAL/PLATELET
Abs Immature Granulocytes: 0.08 10*3/uL — ABNORMAL HIGH (ref 0.00–0.07)
Basophils Absolute: 0.1 10*3/uL (ref 0.0–0.1)
Basophils Relative: 0 %
Eosinophils Absolute: 0 10*3/uL (ref 0.0–0.5)
Eosinophils Relative: 0 %
HCT: 47.3 % (ref 39.0–52.0)
Hemoglobin: 16.5 g/dL (ref 13.0–17.0)
Immature Granulocytes: 1 %
Lymphocytes Relative: 6 %
Lymphs Abs: 0.8 10*3/uL (ref 0.7–4.0)
MCH: 31.3 pg (ref 26.0–34.0)
MCHC: 34.9 g/dL (ref 30.0–36.0)
MCV: 89.8 fL (ref 80.0–100.0)
Monocytes Absolute: 0.9 10*3/uL (ref 0.1–1.0)
Monocytes Relative: 6 %
Neutro Abs: 13.1 10*3/uL — ABNORMAL HIGH (ref 1.7–7.7)
Neutrophils Relative %: 87 %
Platelets: 228 10*3/uL (ref 150–400)
RBC: 5.27 MIL/uL (ref 4.22–5.81)
RDW: 12.9 % (ref 11.5–15.5)
WBC: 14.9 10*3/uL — ABNORMAL HIGH (ref 4.0–10.5)
nRBC: 0 % (ref 0.0–0.2)

## 2023-09-11 LAB — URINE DRUG SCREEN, QUALITATIVE (ARMC ONLY)
Amphetamines, Ur Screen: NOT DETECTED
Barbiturates, Ur Screen: NOT DETECTED
Benzodiazepine, Ur Scrn: NOT DETECTED
Cannabinoid 50 Ng, Ur ~~LOC~~: POSITIVE — AB
Cocaine Metabolite,Ur ~~LOC~~: NOT DETECTED
MDMA (Ecstasy)Ur Screen: NOT DETECTED
Methadone Scn, Ur: NOT DETECTED
Opiate, Ur Screen: NOT DETECTED
Phencyclidine (PCP) Ur S: NOT DETECTED
Tricyclic, Ur Screen: POSITIVE — AB

## 2023-09-11 LAB — ETHANOL: Alcohol, Ethyl (B): <10 mg/dL (ref <10)

## 2023-09-11 MED ORDER — SODIUM CHLORIDE 0.9 % IV BOLUS (SEPSIS)
1000.0000 mL | Freq: Once | INTRAVENOUS | Status: AC
  Administered 2023-09-11: 1000 mL via INTRAVENOUS

## 2023-09-11 NOTE — ED Notes (Signed)
Pt is up and ambulatory to bathroom  Provider in room

## 2023-09-11 NOTE — ED Notes (Signed)
Family at bedside. Resting.

## 2023-09-11 NOTE — Discharge Instructions (Addendum)
Please follow-up with your primary care provider within 1 week or the ED for staple removal.

## 2023-09-11 NOTE — ED Provider Notes (Signed)
  Physical Exam  BP (!) 123/93   Pulse (!) 103   Temp 98.5 F (36.9 C) (Oral)   Resp 16   Ht 5\' 10"  (1.778 m)   Wt 105.7 kg   SpO2 97%   BMI 33.44 kg/m   Physical Exam  Procedures  Procedures  ED Course / MDM    Medical Decision Making Amount and/or Complexity of Data Reviewed Labs: ordered. Radiology: ordered.   Received patient in signout.  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  Large laceration to the frontal scalp which was repaired by previous provider.  Laboratory workup with mild leukocytosis but otherwise reassuring.  CT imaging of head and C-spine with no other acute abnormalities.  Patient was signed out while awaiting metabolization and reassessment.  Reassessed patient after several hours.  He was able to urinate at the bedside.  Was able to discuss with him what happened.  He denies any acute symptoms at this time.  Denies any other injuries.  Asked me discharge at this time.  Discussed with him and his mother in the room if patient is back to his baseline.  They both agree.  They are wanting to go home at this time.  Given strict return precautions.  Told to follow-up with primary care provider for staple removal in 7 to 10 days.   Janith Lima, MD 09/11/23 1016
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
## 2023-09-16 DIAGNOSIS — Z4802 Encounter for removal of sutures: Secondary | ICD-10-CM | POA: Diagnosis not present

## 2023-09-16 DIAGNOSIS — S0101XD Laceration without foreign body of scalp, subsequent encounter: Secondary | ICD-10-CM | POA: Diagnosis not present

## 2023-09-16 DIAGNOSIS — X58XXXD Exposure to other specified factors, subsequent encounter: Secondary | ICD-10-CM | POA: Diagnosis not present

## 2023-12-13 DIAGNOSIS — I69351 Hemiplegia and hemiparesis following cerebral infarction affecting right dominant side: Secondary | ICD-10-CM | POA: Diagnosis not present

## 2023-12-20 DIAGNOSIS — E7800 Pure hypercholesterolemia, unspecified: Secondary | ICD-10-CM | POA: Diagnosis not present

## 2023-12-20 DIAGNOSIS — Z79899 Other long term (current) drug therapy: Secondary | ICD-10-CM | POA: Diagnosis not present

## 2023-12-20 DIAGNOSIS — Z87891 Personal history of nicotine dependence: Secondary | ICD-10-CM | POA: Diagnosis not present

## 2023-12-20 DIAGNOSIS — G43909 Migraine, unspecified, not intractable, without status migrainosus: Secondary | ICD-10-CM | POA: Diagnosis not present

## 2023-12-20 DIAGNOSIS — G4701 Insomnia due to medical condition: Secondary | ICD-10-CM | POA: Diagnosis not present

## 2023-12-20 DIAGNOSIS — Z5982 Transportation insecurity: Secondary | ICD-10-CM | POA: Diagnosis not present

## 2023-12-20 DIAGNOSIS — I69351 Hemiplegia and hemiparesis following cerebral infarction affecting right dominant side: Secondary | ICD-10-CM | POA: Diagnosis not present

## 2023-12-29 DIAGNOSIS — Z5982 Transportation insecurity: Secondary | ICD-10-CM | POA: Diagnosis not present

## 2023-12-29 DIAGNOSIS — I69351 Hemiplegia and hemiparesis following cerebral infarction affecting right dominant side: Secondary | ICD-10-CM | POA: Diagnosis not present

## 2023-12-29 DIAGNOSIS — Z79899 Other long term (current) drug therapy: Secondary | ICD-10-CM | POA: Diagnosis not present

## 2023-12-29 DIAGNOSIS — G43909 Migraine, unspecified, not intractable, without status migrainosus: Secondary | ICD-10-CM | POA: Diagnosis not present

## 2023-12-29 DIAGNOSIS — E7800 Pure hypercholesterolemia, unspecified: Secondary | ICD-10-CM | POA: Diagnosis not present

## 2023-12-29 DIAGNOSIS — Z87891 Personal history of nicotine dependence: Secondary | ICD-10-CM | POA: Diagnosis not present

## 2023-12-29 DIAGNOSIS — G4701 Insomnia due to medical condition: Secondary | ICD-10-CM | POA: Diagnosis not present

## 2023-12-30 DIAGNOSIS — Z5982 Transportation insecurity: Secondary | ICD-10-CM | POA: Diagnosis not present

## 2023-12-30 DIAGNOSIS — G43909 Migraine, unspecified, not intractable, without status migrainosus: Secondary | ICD-10-CM | POA: Diagnosis not present

## 2023-12-30 DIAGNOSIS — G4701 Insomnia due to medical condition: Secondary | ICD-10-CM | POA: Diagnosis not present

## 2023-12-30 DIAGNOSIS — E7800 Pure hypercholesterolemia, unspecified: Secondary | ICD-10-CM | POA: Diagnosis not present

## 2023-12-30 DIAGNOSIS — I69351 Hemiplegia and hemiparesis following cerebral infarction affecting right dominant side: Secondary | ICD-10-CM | POA: Diagnosis not present

## 2023-12-30 DIAGNOSIS — Z87891 Personal history of nicotine dependence: Secondary | ICD-10-CM | POA: Diagnosis not present

## 2023-12-30 DIAGNOSIS — Z79899 Other long term (current) drug therapy: Secondary | ICD-10-CM | POA: Diagnosis not present

## 2024-01-02 DIAGNOSIS — Z87891 Personal history of nicotine dependence: Secondary | ICD-10-CM | POA: Diagnosis not present

## 2024-01-02 DIAGNOSIS — E7800 Pure hypercholesterolemia, unspecified: Secondary | ICD-10-CM | POA: Diagnosis not present

## 2024-01-02 DIAGNOSIS — G4701 Insomnia due to medical condition: Secondary | ICD-10-CM | POA: Diagnosis not present

## 2024-01-02 DIAGNOSIS — G43909 Migraine, unspecified, not intractable, without status migrainosus: Secondary | ICD-10-CM | POA: Diagnosis not present

## 2024-01-02 DIAGNOSIS — Z79899 Other long term (current) drug therapy: Secondary | ICD-10-CM | POA: Diagnosis not present

## 2024-01-02 DIAGNOSIS — Z5982 Transportation insecurity: Secondary | ICD-10-CM | POA: Diagnosis not present

## 2024-01-02 DIAGNOSIS — I69351 Hemiplegia and hemiparesis following cerebral infarction affecting right dominant side: Secondary | ICD-10-CM | POA: Diagnosis not present

## 2024-01-03 DIAGNOSIS — G4701 Insomnia due to medical condition: Secondary | ICD-10-CM | POA: Diagnosis not present

## 2024-01-03 DIAGNOSIS — I69351 Hemiplegia and hemiparesis following cerebral infarction affecting right dominant side: Secondary | ICD-10-CM | POA: Diagnosis not present

## 2024-01-03 DIAGNOSIS — G43909 Migraine, unspecified, not intractable, without status migrainosus: Secondary | ICD-10-CM | POA: Diagnosis not present

## 2024-01-03 DIAGNOSIS — Z5982 Transportation insecurity: Secondary | ICD-10-CM | POA: Diagnosis not present

## 2024-01-03 DIAGNOSIS — Z79899 Other long term (current) drug therapy: Secondary | ICD-10-CM | POA: Diagnosis not present

## 2024-01-03 DIAGNOSIS — Z87891 Personal history of nicotine dependence: Secondary | ICD-10-CM | POA: Diagnosis not present

## 2024-01-03 DIAGNOSIS — E7800 Pure hypercholesterolemia, unspecified: Secondary | ICD-10-CM | POA: Diagnosis not present

## 2024-01-05 DIAGNOSIS — I69351 Hemiplegia and hemiparesis following cerebral infarction affecting right dominant side: Secondary | ICD-10-CM | POA: Diagnosis not present

## 2024-01-05 DIAGNOSIS — R451 Restlessness and agitation: Secondary | ICD-10-CM | POA: Diagnosis not present

## 2024-01-05 DIAGNOSIS — Z7409 Other reduced mobility: Secondary | ICD-10-CM | POA: Diagnosis not present

## 2024-01-05 DIAGNOSIS — R2681 Unsteadiness on feet: Secondary | ICD-10-CM | POA: Diagnosis not present

## 2024-01-05 DIAGNOSIS — G4701 Insomnia due to medical condition: Secondary | ICD-10-CM | POA: Diagnosis not present

## 2024-01-05 DIAGNOSIS — F17210 Nicotine dependence, cigarettes, uncomplicated: Secondary | ICD-10-CM | POA: Diagnosis not present

## 2024-01-05 DIAGNOSIS — E7800 Pure hypercholesterolemia, unspecified: Secondary | ICD-10-CM | POA: Diagnosis not present

## 2024-01-05 DIAGNOSIS — Z1159 Encounter for screening for other viral diseases: Secondary | ICD-10-CM | POA: Diagnosis not present

## 2024-01-09 DIAGNOSIS — Z79899 Other long term (current) drug therapy: Secondary | ICD-10-CM | POA: Diagnosis not present

## 2024-01-09 DIAGNOSIS — Z5982 Transportation insecurity: Secondary | ICD-10-CM | POA: Diagnosis not present

## 2024-01-09 DIAGNOSIS — I69351 Hemiplegia and hemiparesis following cerebral infarction affecting right dominant side: Secondary | ICD-10-CM | POA: Diagnosis not present

## 2024-01-09 DIAGNOSIS — G43909 Migraine, unspecified, not intractable, without status migrainosus: Secondary | ICD-10-CM | POA: Diagnosis not present

## 2024-01-09 DIAGNOSIS — E7800 Pure hypercholesterolemia, unspecified: Secondary | ICD-10-CM | POA: Diagnosis not present

## 2024-01-09 DIAGNOSIS — G4701 Insomnia due to medical condition: Secondary | ICD-10-CM | POA: Diagnosis not present

## 2024-01-09 DIAGNOSIS — Z87891 Personal history of nicotine dependence: Secondary | ICD-10-CM | POA: Diagnosis not present

## 2024-01-11 DIAGNOSIS — Z5982 Transportation insecurity: Secondary | ICD-10-CM | POA: Diagnosis not present

## 2024-01-11 DIAGNOSIS — G43909 Migraine, unspecified, not intractable, without status migrainosus: Secondary | ICD-10-CM | POA: Diagnosis not present

## 2024-01-11 DIAGNOSIS — I69351 Hemiplegia and hemiparesis following cerebral infarction affecting right dominant side: Secondary | ICD-10-CM | POA: Diagnosis not present

## 2024-01-11 DIAGNOSIS — G4701 Insomnia due to medical condition: Secondary | ICD-10-CM | POA: Diagnosis not present

## 2024-01-11 DIAGNOSIS — Z79899 Other long term (current) drug therapy: Secondary | ICD-10-CM | POA: Diagnosis not present

## 2024-01-11 DIAGNOSIS — E7800 Pure hypercholesterolemia, unspecified: Secondary | ICD-10-CM | POA: Diagnosis not present

## 2024-01-11 DIAGNOSIS — Z87891 Personal history of nicotine dependence: Secondary | ICD-10-CM | POA: Diagnosis not present

## 2024-01-12 DIAGNOSIS — R2681 Unsteadiness on feet: Secondary | ICD-10-CM | POA: Diagnosis not present

## 2024-01-16 DIAGNOSIS — Z87891 Personal history of nicotine dependence: Secondary | ICD-10-CM | POA: Diagnosis not present

## 2024-01-16 DIAGNOSIS — Z79899 Other long term (current) drug therapy: Secondary | ICD-10-CM | POA: Diagnosis not present

## 2024-01-16 DIAGNOSIS — E7800 Pure hypercholesterolemia, unspecified: Secondary | ICD-10-CM | POA: Diagnosis not present

## 2024-01-16 DIAGNOSIS — G4701 Insomnia due to medical condition: Secondary | ICD-10-CM | POA: Diagnosis not present

## 2024-01-16 DIAGNOSIS — Z5982 Transportation insecurity: Secondary | ICD-10-CM | POA: Diagnosis not present

## 2024-01-16 DIAGNOSIS — I69351 Hemiplegia and hemiparesis following cerebral infarction affecting right dominant side: Secondary | ICD-10-CM | POA: Diagnosis not present

## 2024-01-16 DIAGNOSIS — G43909 Migraine, unspecified, not intractable, without status migrainosus: Secondary | ICD-10-CM | POA: Diagnosis not present

## 2024-01-30 DIAGNOSIS — R2681 Unsteadiness on feet: Secondary | ICD-10-CM | POA: Diagnosis not present

## 2024-01-30 DIAGNOSIS — G8111 Spastic hemiplegia affecting right dominant side: Secondary | ICD-10-CM | POA: Diagnosis not present

## 2024-01-30 DIAGNOSIS — I69151 Hemiplegia and hemiparesis following nontraumatic intracerebral hemorrhage affecting right dominant side: Secondary | ICD-10-CM | POA: Diagnosis not present

## 2024-01-30 DIAGNOSIS — Z7409 Other reduced mobility: Secondary | ICD-10-CM | POA: Diagnosis not present

## 2024-03-02 DIAGNOSIS — I69359 Hemiplegia and hemiparesis following cerebral infarction affecting unspecified side: Secondary | ICD-10-CM | POA: Diagnosis not present
